# Patient Record
Sex: Male | Born: 1990 | Race: Black or African American | Hispanic: No | Marital: Single | Smoking: Current every day smoker
Health system: Southern US, Community
[De-identification: ages and names within clinical notes are randomized; demographics above are authoritative.]

## PROBLEM LIST (undated history)

## (undated) ENCOUNTER — Emergency Department (HOSPITAL_COMMUNITY): Admission: EM | Payer: Self-pay | Source: Home / Self Care

## (undated) DIAGNOSIS — F32A Depression, unspecified: Secondary | ICD-10-CM

## (undated) DIAGNOSIS — F419 Anxiety disorder, unspecified: Secondary | ICD-10-CM

## (undated) DIAGNOSIS — F4310 Post-traumatic stress disorder, unspecified: Secondary | ICD-10-CM

---
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
## 2010-02-16 ENCOUNTER — Emergency Department (HOSPITAL_COMMUNITY): Admission: EM | Admit: 2010-02-16 | Discharge: 2010-02-16 | Payer: Self-pay | Admitting: Emergency Medicine

## 2010-09-24 ENCOUNTER — Emergency Department (HOSPITAL_COMMUNITY)
Admission: EM | Admit: 2010-09-24 | Discharge: 2010-09-24 | Payer: Self-pay | Source: Home / Self Care | Admitting: Emergency Medicine

## 2010-10-17 ENCOUNTER — Emergency Department (HOSPITAL_COMMUNITY): Admission: EM | Admit: 2010-10-17 | Discharge: 2010-10-17 | Payer: Self-pay | Admitting: Emergency Medicine

## 2011-01-20 ENCOUNTER — Encounter: Payer: Self-pay | Admitting: Emergency Medicine

## 2011-06-15 ENCOUNTER — Emergency Department (HOSPITAL_COMMUNITY)
Admission: EM | Admit: 2011-06-15 | Discharge: 2011-06-15 | Disposition: A | Discharge status: Home / Self Care | Payer: Self-pay | Source: Home / Self Care | Attending: Emergency Medicine | Admitting: Emergency Medicine

## 2011-06-15 DIAGNOSIS — J45909 Unspecified asthma, uncomplicated: Secondary | ICD-10-CM | POA: Insufficient documentation

## 2011-06-15 DIAGNOSIS — F17200 Nicotine dependence, unspecified, uncomplicated: Secondary | ICD-10-CM | POA: Insufficient documentation

## 2011-06-15 DIAGNOSIS — G40909 Epilepsy, unspecified, not intractable, without status epilepticus: Secondary | ICD-10-CM | POA: Insufficient documentation

## 2011-06-15 DIAGNOSIS — E860 Dehydration: Secondary | ICD-10-CM | POA: Insufficient documentation

## 2011-06-15 DIAGNOSIS — R197 Diarrhea, unspecified: Secondary | ICD-10-CM | POA: Insufficient documentation

## 2011-06-15 LAB — DIFFERENTIAL
Basophils Absolute: 0 10*3/uL (ref 0.0–0.1)
Basophils Relative: 0 % (ref 0–1)
Eosinophils Absolute: 0.1 10*3/uL (ref 0.0–0.7)
Eosinophils Relative: 1 % (ref 0–5)
Lymphocytes Relative: 20 % (ref 12–46)
Lymphs Abs: 1.8 10*3/uL (ref 0.7–4.0)
Monocytes Absolute: 0.5 10*3/uL (ref 0.1–1.0)
Monocytes Relative: 5 % (ref 3–12)
Neutro Abs: 6.6 10*3/uL (ref 1.7–7.7)
Neutrophils Relative %: 73 % (ref 43–77)

## 2011-06-15 LAB — URINALYSIS, ROUTINE W REFLEX MICROSCOPIC
Bilirubin Urine: NEGATIVE
Glucose, UA: NEGATIVE mg/dL
Ketones, ur: NEGATIVE mg/dL
Leukocytes, UA: NEGATIVE
Nitrite: NEGATIVE
Protein, ur: NEGATIVE mg/dL
Specific Gravity, Urine: >1.03 — ABNORMAL HIGH (ref 1.005–1.030)
Urobilinogen, UA: 0.2 mg/dL (ref 0.0–1.0)
pH: 6 (ref 5.0–8.0)

## 2011-06-15 LAB — BASIC METABOLIC PANEL
BUN: 15 mg/dL (ref 6–23)
CO2: 29 mEq/L (ref 19–32)
Calcium: 10.5 mg/dL (ref 8.4–10.5)
Chloride: 102 mEq/L (ref 96–112)
Creatinine, Ser: 1.19 mg/dL (ref 0.50–1.35)
GFR calc Af Amer: >60 mL/min (ref >60)
GFR calc non Af Amer: >60 mL/min (ref >60)
Glucose, Bld: 80 mg/dL (ref 70–99)
Potassium: 3.8 mEq/L (ref 3.5–5.1)
Sodium: 138 mEq/L (ref 135–145)

## 2011-06-15 LAB — GLUCOSE, CAPILLARY: Glucose-Capillary: 88 mg/dL (ref 70–99)

## 2011-06-15 LAB — CBC
HCT: 44.4 % (ref 39.0–52.0)
Hemoglobin: 15.1 g/dL (ref 13.0–17.0)
MCH: 28.5 pg (ref 26.0–34.0)
MCHC: 34 g/dL (ref 30.0–36.0)
MCV: 83.8 fL (ref 78.0–100.0)
Platelets: 269 10*3/uL (ref 150–400)
RBC: 5.3 MIL/uL (ref 4.22–5.81)
RDW: 13.8 % (ref 11.5–15.5)
WBC: 9 10*3/uL (ref 4.0–10.5)

## 2011-06-15 LAB — URINE MICROSCOPIC-ADD ON

## 2011-06-17 LAB — URINE CULTURE
Colony Count: 10000
Culture  Setup Time: 201206162021

## 2011-08-18 ENCOUNTER — Encounter: Payer: Self-pay | Admitting: *Deleted

## 2011-08-18 ENCOUNTER — Emergency Department (HOSPITAL_COMMUNITY)
Admission: EM | Admit: 2011-08-18 | Discharge: 2011-08-18 | Disposition: A | Discharge status: Home / Self Care | Payer: Self-pay | Attending: Emergency Medicine | Admitting: Emergency Medicine

## 2011-08-18 DIAGNOSIS — F17200 Nicotine dependence, unspecified, uncomplicated: Secondary | ICD-10-CM | POA: Insufficient documentation

## 2011-08-18 DIAGNOSIS — Z202 Contact with and (suspected) exposure to infections with a predominantly sexual mode of transmission: Secondary | ICD-10-CM | POA: Insufficient documentation

## 2011-08-18 LAB — URINALYSIS, ROUTINE W REFLEX MICROSCOPIC
Bilirubin Urine: NEGATIVE
Glucose, UA: NEGATIVE mg/dL
Hgb urine dipstick: NEGATIVE
Ketones, ur: NEGATIVE mg/dL
Leukocytes, UA: NEGATIVE
Nitrite: NEGATIVE
Protein, ur: NEGATIVE mg/dL
Specific Gravity, Urine: >1.03 — ABNORMAL HIGH (ref 1.005–1.030)
Urobilinogen, UA: 0.2 mg/dL (ref 0.0–1.0)
pH: 5.5 (ref 5.0–8.0)

## 2011-08-18 MED ORDER — CEFTRIAXONE SODIUM 1 G IJ SOLR
500.0000 mg | Freq: Once | INTRAMUSCULAR | Status: AC
  Administered 2011-08-18: 500 mg via INTRAMUSCULAR
  Filled 2011-08-18: qty 1

## 2011-08-18 MED ORDER — AZITHROMYCIN 250 MG PO TABS
1000.0000 mg | ORAL_TABLET | Freq: Once | ORAL | Status: AC
  Administered 2011-08-18: 1000 mg via ORAL
  Filled 2011-08-18: qty 4

## 2011-08-18 NOTE — ED Provider Notes (Signed)
History     CSN: [id number] Arrival date & time: 08/18/2011  6:22 PM  Chief Complaint  Patient presents with  . SEXUALLY TRANSMITTED DISEASE   HPI Comments: Patient comes to ED requesting evaluation for exposure to STD.  States that his sexual partner has been recently treated for gonorrhea.  Patient reports intermittent itching but denies abd pain, rash, penile discharge, swelling or pain.    Patient is a 20 y.o. male presenting with STD exposure. The history is provided by the patient.  Exposure to STD This is a new problem. The current episode started in the past 7 days. The problem occurs intermittently. The problem has been unchanged. Pertinent negatives include no abdominal pain, anorexia, arthralgias, change in bowel habit, chills, fever, headaches, myalgias, nausea, neck pain, numbness, rash, swollen glands, urinary symptoms, vomiting or weakness. The symptoms are aggravated by nothing. He has tried nothing for the symptoms. The treatment provided no relief.    History reviewed. No pertinent past medical history.  History reviewed. No pertinent past surgical history.  History reviewed. No pertinent family history.  History  Substance Use Topics  . Smoking status: Current Everyday Smoker -- 2.0 packs/day  . Smokeless tobacco: Not on file  . Alcohol Use: No      Review of Systems  Constitutional: Negative for fever, chills and appetite change.  HENT: Negative for neck pain.   Gastrointestinal: Negative for nausea, vomiting, abdominal pain, anorexia and change in bowel habit.  Genitourinary: Negative for dysuria, frequency, decreased urine volume, discharge, penile swelling, scrotal swelling, difficulty urinating, penile pain and testicular pain.  Musculoskeletal: Negative for myalgias and arthralgias.  Skin: Negative for color change, rash and wound.  Neurological: Negative for weakness, numbness and headaches.  Hematological: Does not bruise/bleed easily.  All other  systems reviewed and are negative.    Physical Exam  BP 122/73  Pulse 92  Temp(Src) 98.2 F (36.8 C) (Oral)  Resp 18  Ht 6' (1.829 m)  Wt 165 lb 11.2 oz (75.161 kg)  BMI 22.47 kg/m2  SpO2 100%  Physical Exam  Nursing note and vitals reviewed. Constitutional: He is oriented to person, place, and time. He appears well-developed and well-nourished. No distress.  HENT:  Head: Normocephalic and atraumatic.  Mouth/Throat: Oropharynx is clear and moist.  Neck: No thyromegaly present.  Cardiovascular: Normal rate and regular rhythm.   Pulmonary/Chest: Effort normal and breath sounds normal. No respiratory distress.  Abdominal: Soft. He exhibits no distension and no mass. There is no tenderness. There is no rebound and no guarding.  Genitourinary: Testes normal and penis normal. Cremasteric reflex is present. Circumcised. No phimosis, paraphimosis, hypospadias, penile erythema or penile tenderness. No discharge found.  Musculoskeletal: Normal range of motion.  Lymphadenopathy:    He has no cervical adenopathy.  Neurological: He is alert and oriented to person, place, and time.  Skin: Skin is warm and dry.    ED Course  Procedures  MDM   2040 Patient denies any sx's at this time.  He reports recent  exposure to [facility] without penile discharge.  Will treat today and I have advised him of safe sex practices and to have all partners tested.  GC and chlamydia culture pending  The patient appears reasonably screened and/or stabilized for discharge and I doubt any other medical condition or other [facility] requiring further screening, evaluation, or treatment in the ED at this time prior to discharge.   Filed Vitals:   08/18/11 1815  BP: 122/73  Pulse: 92  Temp: 98.2 F (36.8 C)  Resp: 18     MEDICATIONS GIVEN IN THE ED:   Medications  azithromycin (ZITHROMAX) tablet 1,000 mg (1000 mg Oral Given 08/18/11 2038)  cefTRIAXone (ROCEPHIN) injection 500 mg (500 mg Intramuscular Given 08/18/11  2037)     Roberts Bon L. [place], [place] 08/24/11 2251

## 2011-08-18 NOTE — ED Notes (Addendum)
Pt states his partner was dx with gonorrhea and he began to have sx last week. Pt states his sx are itching.

## 2011-08-20 LAB — GC/CHLAMYDIA PROBE AMP, GENITAL
Chlamydia, DNA Probe: NEGATIVE
GC Probe Amp, Genital: NEGATIVE

## 2011-08-21 NOTE — ED Provider Notes (Signed)
Medical screening examination/treatment/procedure(s) were performed by non-physician practitioner and as supervising physician I was immediately available for consultation/collaboration.    Benny Lennert, MD 08/21/11 [phone]

## 2011-08-26 NOTE — ED Provider Notes (Signed)
Medical screening examination/treatment/procedure(s) were performed by non-physician practitioner and as supervising physician I was immediately available for consultation/collaboration.   Benny Lennert, MD 08/26/11 1037

## 2011-11-22 ENCOUNTER — Emergency Department (HOSPITAL_COMMUNITY)
Admission: EM | Admit: 2011-11-22 | Discharge: 2011-11-22 | Disposition: A | Discharge status: Home / Self Care | Payer: Self-pay | Attending: Emergency Medicine | Admitting: Emergency Medicine

## 2011-11-22 ENCOUNTER — Emergency Department (HOSPITAL_COMMUNITY): Payer: Self-pay

## 2011-11-22 ENCOUNTER — Encounter (HOSPITAL_COMMUNITY): Payer: Self-pay | Admitting: Emergency Medicine

## 2011-11-22 DIAGNOSIS — M545 Low back pain, unspecified: Secondary | ICD-10-CM | POA: Insufficient documentation

## 2011-11-22 DIAGNOSIS — M549 Dorsalgia, unspecified: Secondary | ICD-10-CM

## 2011-11-22 MED ORDER — IBUPROFEN 800 MG PO TABS: 800.0000 mg | ORAL_TABLET | Freq: Three times a day (TID) | ORAL | Status: AC | PRN

## 2011-11-22 MED ORDER — HYDROCODONE-ACETAMINOPHEN 5-325 MG PO TABS: 1.0000 | ORAL_TABLET | ORAL | Status: AC | PRN

## 2011-11-22 MED ORDER — HYDROCODONE-ACETAMINOPHEN 5-325 MG PO TABS
1.0000 | ORAL_TABLET | Freq: Once | ORAL | Status: AC
  Administered 2011-11-22: 1 via ORAL
  Filled 2011-11-22: qty 1

## 2011-11-22 MED ORDER — IBUPROFEN 800 MG PO TABS
800.0000 mg | ORAL_TABLET | Freq: Once | ORAL | Status: AC
  Administered 2011-11-22: 800 mg via ORAL
  Filled 2011-11-22: qty 1

## 2011-11-22 NOTE — ED Notes (Signed)
Patient with no complaints at this time. Respirations even and unlabored. Skin warm/dry. Discharge instructions reviewed with patient at this time. Patient given opportunity to voice concerns/ask questions. Patient discharged at this time and left Emergency Department with steady gait.   

## 2011-11-22 NOTE — ED Notes (Signed)
Julie Idol, PA at bedside for patient evaluation. 

## 2011-11-22 NOTE — ED Notes (Signed)
Pt c/o lower back pain down both legs x 2 weeks. Nad.

## 2011-11-24 NOTE — ED Provider Notes (Signed)
Medical screening examination/treatment/procedure(s) were performed by non-physician practitioner and as supervising physician I was immediately available for consultation/collaboration. Devoria Albe, MD, Armando Gang   Ward Givens, MD 11/24/11 [phone]

## 2011-11-24 NOTE — ED Provider Notes (Signed)
History     CSN: [id number] Arrival date & time: 11/22/2011  1:42 PM   First MD Initiated Contact with Patient 11/22/11 1356      Chief Complaint  Patient presents with  . Back Pain    (Consider location/radiation/quality/duration/timing/severity/associated sxs/prior treatment) Patient is a 20 y.o. male presenting with back pain. The history is provided by the patient.  Back Pain  This is a new problem. The current episode started 2 days ago. The problem occurs constantly. 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 The pain is present in the lumbar spine. The quality of the pain is described as aching. The pain does not radiate. The pain is at a severity of 10/10. The symptoms are aggravated by certain positions and bending. The pain is the same all the time. Pertinent negatives include no chest pain, no fever, no numbness, no headaches, no abdominal pain, no bowel incontinence, no perianal numbness, no dysuria, no paresthesias, no paresis, no tingling and no weakness. He has tried nothing for the symptoms.    History reviewed. No pertinent past medical history.  History reviewed. No pertinent past surgical history.  History reviewed. No pertinent family history.  History  Substance Use Topics  . Smoking status: Current Everyday Smoker -- 2.0 packs/day  . Smokeless tobacco: Not on file  . Alcohol Use: No      Review of Systems  Constitutional: Negative for fever.  HENT: Negative for congestion, sore throat and neck pain.   Eyes: Negative.   Respiratory: Negative for chest tightness and shortness of breath.   Cardiovascular: Negative for chest pain.  Gastrointestinal: Negative for nausea, abdominal pain, abdominal distention and bowel incontinence.  Genitourinary: Negative.  Negative for dysuria and difficulty urinating.  Musculoskeletal: Positive  for back pain. Negative for joint swelling and arthralgias.  Skin: Negative.  Negative for rash and wound.  Neurological: Negative for dizziness, tingling, weakness, light-headedness, numbness, headaches and paresthesias.  Hematological: Negative.   Psychiatric/Behavioral: Negative.     Allergies  Review of patient's allergies indicates no known allergies.  Home Medications   Current Outpatient Rx  Name Route Sig Dispense Refill  . IBUPROFEN 800 MG PO TABS Oral Take 800 mg by mouth every 8 (eight) hours as needed. Pain     . HYDROCODONE-ACETAMINOPHEN 5-325 MG PO TABS Oral Take 1 tablet by mouth every 4 (four) hours as needed for pain. 20 tablet 0  . IBUPROFEN 800 MG PO TABS Oral Take 1 tablet (800 mg total) by mouth every 8 (eight) hours as needed for pain. 20 tablet 0    BP 125/66  Pulse 68  Temp(Src) 98.5 F (36.9 C) (Oral)  Resp 17  Ht 6\' 4"  (1.93 m)  Wt 170 lb (77.111 kg)  BMI 20.69 kg/m2  SpO2 100%  Physical Exam  Nursing note and vitals reviewed. Constitutional: He is oriented to person, place, and time. He appears well-developed and well-nourished.  HENT:  Head: Normocephalic.  Eyes: Conjunctivae are normal.  Neck: Normal range of motion. Neck supple.  Cardiovascular: Regular rhythm and intact distal pulses.        Pedal pulses normal.  Pulmonary/Chest: Effort normal. He has no wheezes.  Abdominal: Soft. Bowel sounds are normal. He exhibits no distension and no mass.  Musculoskeletal: Normal range of motion. He exhibits no edema.       Lumbar back:  He exhibits tenderness. He exhibits no swelling, no edema and no spasm.  Neurological: He is alert and oriented to person, place, and time. He has normal strength. He displays no atrophy and no tremor. No cranial nerve deficit or sensory deficit. Gait normal.  Reflex Scores:      Patellar reflexes are 2+ on the right side and 2+ on the left side.      Achilles reflexes are 2+ on the right side and 2+ on the left side.       No strength deficit noted in hip and knee flexor and extensor muscle groups.  Ankle flexion and extension intact.  Skin: Skin is warm and dry.  Psychiatric: He has a normal mood and affect.    ED Course  Procedures (including critical care time)  Labs Reviewed - No data to display Dg Lumbar Spine Complete  11/22/2011  *RADIOLOGY REPORT*  Clinical Data: Low back pain  LUMBAR SPINE - COMPLETE 4+ VIEW  Comparison: None.  Findings: No vertebral compression deformities to suggest compression fracture.  Schmorl's nodes are present at multiple levels in the lower thoracic and lower lumbar spine.  Disc height is maintained.  No pars defect.  IMPRESSION: No acute bony pathology.  Original Report Authenticated By: Donavan Burnet, M.D.     1. Back pain       MDM  Recurrent low back pain with no neuro deficit on exam or by history.        Candis Musa, PA 11/24/11 [phone]

## 2012-03-07 ENCOUNTER — Encounter (HOSPITAL_COMMUNITY): Payer: Self-pay

## 2012-03-07 ENCOUNTER — Emergency Department (HOSPITAL_COMMUNITY): Payer: Self-pay

## 2012-03-07 ENCOUNTER — Emergency Department (HOSPITAL_COMMUNITY)
Admission: EM | Admit: 2012-03-07 | Discharge: 2012-03-07 | Disposition: A | Discharge status: Home / Self Care | Payer: Self-pay | Attending: Emergency Medicine | Admitting: Emergency Medicine

## 2012-03-07 DIAGNOSIS — W010XXA Fall on same level from slipping, tripping and stumbling without subsequent striking against object, initial encounter: Secondary | ICD-10-CM | POA: Insufficient documentation

## 2012-03-07 DIAGNOSIS — Z79899 Other long term (current) drug therapy: Secondary | ICD-10-CM | POA: Insufficient documentation

## 2012-03-07 DIAGNOSIS — M25519 Pain in unspecified shoulder: Secondary | ICD-10-CM | POA: Insufficient documentation

## 2012-03-07 DIAGNOSIS — M25512 Pain in left shoulder: Secondary | ICD-10-CM

## 2012-03-07 DIAGNOSIS — M79609 Pain in unspecified limb: Secondary | ICD-10-CM | POA: Insufficient documentation

## 2012-03-07 DIAGNOSIS — F17200 Nicotine dependence, unspecified, uncomplicated: Secondary | ICD-10-CM | POA: Insufficient documentation

## 2012-03-07 DIAGNOSIS — M25569 Pain in unspecified knee: Secondary | ICD-10-CM | POA: Insufficient documentation

## 2012-03-07 MED ORDER — METHOCARBAMOL 750 MG PO TABS: ORAL_TABLET | ORAL | Status: DC

## 2012-03-07 MED ORDER — IBUPROFEN 800 MG PO TABS
800.0000 mg | ORAL_TABLET | Freq: Once | ORAL | Status: AC
  Administered 2012-03-07: 800 mg via ORAL
  Filled 2012-03-07: qty 1

## 2012-03-07 MED ORDER — METHOCARBAMOL 500 MG PO TABS
1000.0000 mg | ORAL_TABLET | Freq: Once | ORAL | Status: AC
  Administered 2012-03-07: 1000 mg via ORAL
  Filled 2012-03-07: qty 2

## 2012-03-07 NOTE — ED Notes (Addendum)
Pt states was helping "someone move all day and now is sore" denies trauma/injury to bilateral shoulders and rt knee. Refers to knee pain/sorness as "its been bothering me but worse today". No swelling or deformities noted. No distress noted, pt and family asking for repeat sodas and crackers and "maybe a sandwich" All informed of limitation on food budget. No c/o voiced.

## 2012-03-07 NOTE — ED Notes (Signed)
1.Pt was moving furniture today and is now having severe right leg and bil shoulder pain, also fell down a hill 2. Wants rash that is all over his body for 3 months, checked out

## 2012-03-07 NOTE — Discharge Instructions (Signed)
Cryotherapy Cryotherapy means treatment with cold. Ice or gel packs can be used to reduce both pain and swelling. Ice is the most helpful within the first 24 to 48 hours after an injury or flareup from overusing a muscle or joint. Sprains, strains, spasms, burning pain, shooting pain, and aches can all be eased with ice. Ice can also be used when recovering from surgery. Ice is effective, has very few side effects, and is safe for most people to use. PRECAUTIONS  Ice is not a safe treatment option for people with:  Raynaud's phenomenon. This is a condition affecting small blood vessels in the extremities. Exposure to cold may cause your problems to return.   Cold hypersensitivity. There are many forms of cold hypersensitivity, including:   Cold urticaria. Red, itchy hives appear on the skin when the tissues begin to warm after being iced.   Cold erythema. This is a red, itchy rash caused by exposure to cold.   Cold hemoglobinuria. Red blood cells break down when the tissues begin to warm after being iced. The hemoglobin that carry oxygen are passed into the urine because they cannot combine with blood proteins fast enough.   Numbness or altered sensitivity in the area being iced.  If you have any of the following conditions, do not use ice until you have discussed cryotherapy with your caregiver:  Heart conditions, such as arrhythmia, angina, or chronic heart disease.   High blood pressure.   Healing wounds or open skin in the area being iced.   Current infections.   Rheumatoid arthritis.   Poor circulation.   Diabetes.  Ice slows the blood flow in the region it is applied. This is beneficial when trying to stop inflamed tissues from spreading irritating chemicals to surrounding tissues. However, if you expose your skin to cold temperatures for too long or without the proper protection, you can damage your skin or nerves. Watch for signs of skin damage due to cold. HOME CARE  INSTRUCTIONS Follow these tips to use ice and cold packs safely.  Place a dry or damp towel between the ice and skin. A damp towel will cool the skin more quickly, so you may need to shorten the time that the ice is used.   For a more rapid response, add gentle compression to the ice.   Ice for no more than 10 to 20 minutes at a time. The bonier the area you are icing, the less time it will take to get the benefits of ice.   Check your skin after 5 minutes to make sure there are no signs of a poor response to cold or skin damage.   Rest 20 minutes or more in between uses.   Once your skin is numb, you can end your treatment. You can test numbness by very lightly touching your skin. The touch should be so light that you do not see the skin dimple from the pressure of your fingertip. When using ice, most people will feel these normal sensations in this order: cold, burning, aching, and numbness.   Do not use ice on someone who cannot communicate their responses to pain, such as small children or people with dementia.  HOW TO MAKE AN ICE PACK Ice packs are the most common way to use ice therapy. Other methods include ice massage, ice baths, and cryo-sprays. Muscle creams that cause a cold, tingly feeling do not offer the same benefits that ice offers and should not be used as a substitute  unless recommended by your caregiver. To make an ice pack, do one of the following:  Place crushed ice or a bag of frozen vegetables in a sealable plastic bag. Squeeze out the excess air. Place this bag inside another plastic bag. Slide the bag into a pillowcase or place a damp towel between your skin and the bag.   Mix 3 parts water with 1 part rubbing alcohol. Freeze the mixture in a sealable plastic bag. When you remove the mixture from the freezer, it will be slushy. Squeeze out the excess air. Place this bag inside another plastic bag. Slide the bag into a pillowcase or place a damp towel between your skin  and the bag.  SEEK MEDICAL CARE IF:  You develop white spots on your skin. This may give the skin a blotchy (mottled) appearance.   Your skin turns blue or pale.   Your skin becomes waxy or hard.   Your swelling gets worse.  MAKE SURE YOU:   Understand these instructions.   Will watch your condition.   Will get help right away if you are not doing well or get worse.  Document Released: 08/12/2011 Document Revised: 12/05/2011 Document Reviewed: 08/12/2011 [facility] Patient Information 2012 [place], [place].Knee Immobilization You have been prescribed a knee immobilizer. This is used to support and protect an injured or painful knee. Knee immobilizers keep your knee from being used while it is healing. Some of the common immobilizers used include splints (air, plaster, fiberglass or aluminum) or casts. Wear your knee immobilizer as instructed and only remove it as instructed. If the immobilizer is used to protect broken bones, torn ligaments or torn cartilage, it may be 4 to 6 weeks before you are able to begin rehabilitating your knee, and it may be longer than that before you are able to return to athletic activity. HOME CARE INSTRUCTIONS   Use powder to control irritation from sweat and friction.   Adjust the immobilizer to be firm but not tight. Signs of an immobilizer that is too tight include:   Swelling.   Numbness.   Color change in your foot or ankle.   Increased pain.   While resting, raise your leg above the level of your heart. This reduces throbbing and helps healing. Prop it up with pillows.   Remove the immobilizer to bathe and sleep. Wear it other times until you see your doctor again.  When your splint or cast is removed:   Stretching and strengthening are important in caring for and preventing knee injuries. If your knee begins to get sore while you are conditioning, decrease or back off your activities until you no longer have discomfort. Then gradually resume  your activities.   When strengthening your knee, increase your activities a little at a time so as not to develop injuries from over use.   Work out an exercise plan with your caregiver or physical therapist to get the best program for you.  SEEK IMMEDIATE MEDICAL CARE IF:   Your knee seems to be getting worse rather than better.   You have increasing pain or swelling in the knee, foot, or ankle.   You have problems caused by the knee immobilizer or it breaks or needs replacement.   You have increased swelling or redness or soreness (inflammation) in your knee.   Your leg becomes warm and more painful.   You develop an unexplained temperature over 102 F (38.9 C).  MAKE SURE YOU:   Understand these instructions.  Will watch your condition.   Will get help right away if you are not doing well or get worse.  Document Released: 12/16/2005 Document Revised: 12/05/2011 Document Reviewed: 06/03/2007 [facility] Patient Information 2012 [place], [place].   Take the robaxin as directed.  Take ibuprofen up to 800 mg every 8 hrs with food.  Wear the knee immobilizer for comfort and stability.  Apply ice 20-30 min several times daily.  Follow up with dr. Hilda Lias as needed.

## 2012-03-07 NOTE — ED Provider Notes (Addendum)
History     CSN: [id number]  Arrival date & time 03/07/12  1843   First MD Initiated Contact with Patient 03/07/12 1909      Chief Complaint  Patient presents with  . Leg Pain  . Shoulder Pain    (Consider location/radiation/quality/duration/timing/severity/associated sxs/prior treatment) HPI Comments: Pt states he was moving furniture today and hurt his L shoulder and R knee.  He also slipped and fell down a hill today.no injuries specifically from the fall.  The history is provided by the patient. No language interpreter was used.    History reviewed. No pertinent past medical history.  History reviewed. No pertinent past surgical history.  No family history on file.  History  Substance Use Topics  . Smoking status: Current Everyday Smoker -- 2.0 packs/day    Types: Cigarettes  . Smokeless tobacco: Not on file  . Alcohol Use: No      Review of Systems  Musculoskeletal: Negative for back pain.       Knee and shoulder injuries.  Neurological: Negative for weakness and numbness.    Allergies  Review of patient's allergies indicates no known allergies.  Home Medications   Current Outpatient Rx  Name Route Sig Dispense Refill  . METHOCARBAMOL 750 MG PO TABS  One or two tabs po QID 40 tablet 0    BP 123/78  Pulse 96  Temp 98.2 F (36.8 C)  Resp 20  Wt 150 lb (68.04 kg)  SpO2 100%  Physical Exam  Nursing note and vitals reviewed. Constitutional: He is oriented to person, place, and time. He appears well-developed and well-nourished.  HENT:  Head: Normocephalic and atraumatic.  Eyes: EOM are normal.  Neck: Normal range of motion.  Cardiovascular: Normal rate, regular rhythm, normal heart sounds and intact distal pulses.   Pulmonary/Chest: Effort normal and breath sounds normal. No respiratory distress.  Abdominal: Soft. He exhibits no distension. There is no tenderness.  Musculoskeletal: He exhibits tenderness.       Left shoulder: He exhibits  tenderness, bony tenderness and pain. He exhibits normal range of motion, no swelling, no effusion, no crepitus, no deformity, normal pulse and normal strength.       Right knee: He exhibits bony tenderness. He exhibits normal range of motion, no swelling, no effusion, no ecchymosis and no deformity. tenderness found.       Diffuse pain in L shoulder and R knee.  Mild PT to both.  FROM.  Neurological: He is alert and oriented to person, place, and time.  Skin: Skin is warm and dry.  Psychiatric: He has a normal mood and affect. Judgment normal.    ED Course  Procedures (including critical care time)  Labs Reviewed - No data to display Dg Shoulder Left  03/07/2012  *RADIOLOGY REPORT*  Clinical Data: Left shoulder pain following a fall today.  LEFT SHOULDER - 2+ VIEW  Comparison: None.  Findings: Normal appearing bones and soft tissues without fracture or dislocation.  IMPRESSION: Normal examination.  Original Report Authenticated By: Darrol Angel, M.D.   Dg Knee Complete 4 Views Right  03/07/2012  *RADIOLOGY REPORT*  Clinical Data: Right knee pain following a fall today.  RIGHT KNEE - COMPLETE 4+ VIEW  Comparison: None.  Findings: Normal appearing bones and soft tissues without fracture, dislocation or effusion.  IMPRESSION: Normal examination.  Original Report Authenticated By: Darrol Angel, M.D.     1. Shoulder pain, left   2. Knee pain  MDM  Knee immob, ice , ibuprofen and rx for robaxin #40. F/u with dr. Hilda Lias prn        Worthy Rancher, PA 03/07/12 2046  Evalina Field, [place] 07/04/12 [phone]

## 2012-03-08 NOTE — ED Provider Notes (Signed)
Medical screening examination/treatment/procedure(s) were performed by non-physician practitioner and as supervising physician I was immediately available for consultation/collaboration.   Quanasia Defino, MD 03/08/12 1219 

## 2012-07-06 NOTE — ED Provider Notes (Signed)
Medical screening examination/treatment/procedure(s) were performed by non-physician practitioner and as supervising physician I was immediately available for consultation/collaboration.  Geoffery Lyons, MD 07/06/12 [phone]

## 2012-12-02 ENCOUNTER — Emergency Department (HOSPITAL_COMMUNITY)
Admission: EM | Admit: 2012-12-02 | Discharge: 2012-12-02 | Disposition: A | Discharge status: Home / Self Care | Payer: Self-pay | Attending: Emergency Medicine | Admitting: Emergency Medicine

## 2012-12-02 ENCOUNTER — Encounter (HOSPITAL_COMMUNITY): Payer: Self-pay | Admitting: *Deleted

## 2012-12-02 DIAGNOSIS — IMO0001 Reserved for inherently not codable concepts without codable children: Secondary | ICD-10-CM | POA: Insufficient documentation

## 2012-12-02 DIAGNOSIS — F17200 Nicotine dependence, unspecified, uncomplicated: Secondary | ICD-10-CM | POA: Insufficient documentation

## 2012-12-02 DIAGNOSIS — J029 Acute pharyngitis, unspecified: Secondary | ICD-10-CM | POA: Insufficient documentation

## 2012-12-02 DIAGNOSIS — J3489 Other specified disorders of nose and nasal sinuses: Secondary | ICD-10-CM | POA: Insufficient documentation

## 2012-12-02 DIAGNOSIS — R05 Cough: Secondary | ICD-10-CM

## 2012-12-02 DIAGNOSIS — R6883 Chills (without fever): Secondary | ICD-10-CM | POA: Insufficient documentation

## 2012-12-02 DIAGNOSIS — R0789 Other chest pain: Secondary | ICD-10-CM | POA: Insufficient documentation

## 2012-12-02 DIAGNOSIS — R059 Cough, unspecified: Secondary | ICD-10-CM | POA: Insufficient documentation

## 2012-12-02 MED ORDER — IBUPROFEN 800 MG PO TABS: 800.0000 mg | ORAL_TABLET | Freq: Three times a day (TID) | ORAL | Status: DC

## 2012-12-02 MED ORDER — GUAIFENESIN-CODEINE 100-10 MG/5ML PO SYRP: 10.0000 mL | ORAL_SOLUTION | Freq: Three times a day (TID) | ORAL | Status: AC | PRN

## 2012-12-02 NOTE — ED Provider Notes (Signed)
History     CSN: [id number]  Arrival date & time 12/02/12  1116   First MD Initiated Contact with Patient 12/02/12 1133      Chief Complaint  Patient presents with  . Cough  . Sore Throat    (Consider location/radiation/quality/duration/timing/severity/associated sxs/prior treatment) Patient is a 21 y.o. male presenting with cough. The history is provided by the patient.  Cough This is a new problem. The current episode started more than 2 days ago. The problem occurs every few minutes. The problem has been gradually worsening. The cough is productive of sputum. There has been no fever. Associated symptoms include chills, rhinorrhea, sore throat and myalgias. Pertinent negatives include no chest pain, no ear pain, no headaches, no shortness of breath and no wheezing. Associated symptoms comments: nasal congestion, myalgias. He has tried nothing for the symptoms. The treatment provided no relief. He is a smoker. His past medical history does not include pneumonia or asthma.    History reviewed. No pertinent past medical history.  History reviewed. No pertinent past surgical history.  No family history on file.  History  Substance Use Topics  . Smoking status: Current Every Day Smoker -- 2.0 packs/day    Types: Cigarettes  . Smokeless tobacco: Not on file  . Alcohol Use: No      Review of Systems  Constitutional: Positive for chills. Negative for fever, activity change and appetite change.  HENT: Positive for congestion, sore throat and rhinorrhea. Negative for ear pain, facial swelling, trouble swallowing, neck pain and neck stiffness.   Eyes: Negative for visual disturbance.  Respiratory: Positive for cough and chest tightness. Negative for shortness of breath, wheezing and stridor.   Cardiovascular: Negative for chest pain.  Gastrointestinal: Negative for nausea, vomiting and abdominal pain.  Genitourinary: Negative for dysuria and flank pain.  Musculoskeletal: Positive  for myalgias.  Skin: Negative.   Neurological: Negative for dizziness, weakness, numbness and headaches.  Hematological: Negative for adenopathy.  Psychiatric/Behavioral: Negative for confusion.  All other systems reviewed and are negative.    Allergies  Review of patient's allergies indicates no known allergies.  Home Medications  No current outpatient prescriptions on file.  BP 117/63  Pulse 96  Temp 98.5 F (36.9 C) (Oral)  Resp 20  Ht 6' (1.829 m)  Wt 165 lb (74.844 kg)  BMI 22.38 kg/m2  SpO2 100%  Physical Exam  Nursing note and vitals reviewed. Constitutional: He is oriented to person, place, and time. He appears well-developed and well-nourished. No distress.  HENT:  Head: Normocephalic and atraumatic.  Right Ear: Tympanic membrane and ear canal normal.  Left Ear: Tympanic membrane and ear canal normal.  Mouth/Throat: Uvula is midline, oropharynx is clear and moist and mucous membranes are normal. No oropharyngeal exudate.  Eyes: Conjunctivae normal and EOM are normal. Pupils are equal, round, and reactive to light.  Neck: Normal range of motion. Neck supple.  Cardiovascular: Normal rate, regular rhythm, normal heart sounds and intact distal pulses.   No murmur heard. Pulmonary/Chest: Effort normal and breath sounds normal. No respiratory distress. He has no wheezes. He has no rales. He exhibits no tenderness.  Musculoskeletal: Normal range of motion. He exhibits no edema.  Lymphadenopathy:    He has no cervical adenopathy.  Neurological: He is alert and oriented to person, place, and time. He exhibits normal muscle tone. Coordination normal.  Skin: Skin is warm and dry.    ED Course  Procedures (including critical care time)  Labs Reviewed - No  data to display No results found.      MDM   Patient is alert, vital signs are stable, no hypoxia, tachypnea or tachycardia. Patient is well appearing. Likely viral URI. Patient agrees to rest, fluids, and I  will prescribe cough medication for symptomatic treatment.   Pt to f/u with PMD if needed.  Prescribed: Robitussin AC ibuprofen    Emalyn Schou L. [place], [place] 12/02/12 2035

## 2012-12-02 NOTE — ED Notes (Signed)
C/o cough, body aches, and sore throat x 5 days.

## 2012-12-04 NOTE — ED Provider Notes (Signed)
Medical screening examination/treatment/procedure(s) were performed by non-physician practitioner and as supervising physician I was immediately available for consultation/collaboration. Ivan Lacher, MD, FACEP   Zeki Bedrosian L Jarmel Linhardt, MD 12/04/12 2120 

## 2012-12-26 ENCOUNTER — Emergency Department (HOSPITAL_COMMUNITY)
Admission: EM | Admit: 2012-12-26 | Discharge: 2012-12-26 | Disposition: A | Discharge status: Home / Self Care | Payer: Self-pay | Attending: Emergency Medicine | Admitting: Emergency Medicine

## 2012-12-26 ENCOUNTER — Emergency Department (HOSPITAL_COMMUNITY): Payer: Self-pay

## 2012-12-26 ENCOUNTER — Encounter (HOSPITAL_COMMUNITY): Payer: Self-pay | Admitting: Emergency Medicine

## 2012-12-26 DIAGNOSIS — R51 Headache: Secondary | ICD-10-CM

## 2012-12-26 DIAGNOSIS — F17200 Nicotine dependence, unspecified, uncomplicated: Secondary | ICD-10-CM | POA: Insufficient documentation

## 2012-12-26 DIAGNOSIS — G43909 Migraine, unspecified, not intractable, without status migrainosus: Secondary | ICD-10-CM | POA: Insufficient documentation

## 2012-12-26 DIAGNOSIS — R42 Dizziness and giddiness: Secondary | ICD-10-CM | POA: Insufficient documentation

## 2012-12-26 MED ORDER — KETOROLAC TROMETHAMINE 30 MG/ML IJ SOLN
30.0000 mg | Freq: Once | INTRAMUSCULAR | Status: AC
  Administered 2012-12-26: 30 mg via INTRAVENOUS
  Filled 2012-12-26: qty 1

## 2012-12-26 MED ORDER — TRAMADOL HCL 50 MG PO TABS: ORAL_TABLET | ORAL | Status: DC

## 2012-12-26 NOTE — ED Notes (Signed)
Patient with no complaints at this time. Respirations even and unlabored. Skin warm/dry. Discharge instructions reviewed with patient at this time. Patient given opportunity to voice concerns/ask questions. IV removed per policy and band-aid applied to site. Patient discharged at this time and left Emergency Department with steady gait.  

## 2012-12-26 NOTE — ED Notes (Signed)
Pt c/o migraine along with intermittent dizziness and weakness.

## 2012-12-26 NOTE — ED Provider Notes (Signed)
History   This chart was scribed for Hunter Lennert, MD by Sofie Rower, ED Scribe. The patient was seen in room [facility]04/[facility]04 and the patient's care was started at 1:31PM.     CSN: [id number]  Arrival date & time 12/26/12  1203   First MD Initiated Contact with Patient 12/26/12 1331      Chief Complaint  Patient presents with  . Migraine  . Dizziness    (Consider location/radiation/quality/duration/timing/severity/associated sxs/prior treatment) Patient is a 21 y.o. male presenting with migraines. The history is provided by the patient. No language interpreter was used.  Migraine This is a recurrent problem. The current episode started 6 to 12 hours ago. The problem occurs constantly. The problem has been gradually worsening. Associated symptoms include headaches. Pertinent negatives include no chest pain, no abdominal pain and no shortness of breath. Nothing aggravates the symptoms. The symptoms are relieved by position and rest. He has tried rest for the symptoms. The treatment provided mild relief.    PCP is Dr. Renard Matter.   History reviewed. No pertinent past medical history.  History reviewed. No pertinent past surgical history.  History reviewed. No pertinent family history.  History  Substance Use Topics  . Smoking status: Current Every Day Smoker -- 2.0 packs/day    Types: Cigarettes  . Smokeless tobacco: Not on file  . Alcohol Use: No      Review of Systems  Constitutional: Negative for fatigue.  HENT: Negative for congestion, sinus pressure and ear discharge.   Eyes: Negative for discharge.  Respiratory: Negative for cough and shortness of breath.   Cardiovascular: Negative for chest pain.  Gastrointestinal: Negative for abdominal pain and diarrhea.  Genitourinary: Negative for frequency and hematuria.  Musculoskeletal: Negative for back pain.  Skin: Negative for rash.  Neurological: Positive for dizziness and headaches. Negative for seizures.  Hematological:  Negative.   Psychiatric/Behavioral: Negative for hallucinations.    Allergies  Ibuprofen  Home Medications   Current Outpatient Rx  Name  Route  Sig  Dispense  Refill  . IBUPROFEN 800 MG PO TABS   Oral   Take 1 tablet (800 mg total) by mouth 3 (three) times daily. Take with food   21 tablet   0     BP 118/59  Pulse 67  Temp 98.5 F (36.9 C)  SpO2 100%  Physical Exam  Nursing note and vitals reviewed. Constitutional: He is oriented to person, place, and time. He appears well-developed.  HENT:  Head: Normocephalic and atraumatic.  Eyes: Conjunctivae normal and EOM are normal. No scleral icterus.  Neck: Neck supple. No thyromegaly present.  Cardiovascular: Normal rate and regular rhythm.  Exam reveals no gallop and no friction rub.   No murmur heard. Pulmonary/Chest: No stridor. He has no wheezes. He has no rales. He exhibits no tenderness.  Abdominal: He exhibits no distension. There is no tenderness. There is no rebound.  Musculoskeletal: Normal range of motion. He exhibits no edema.  Lymphadenopathy:    He has no cervical adenopathy.  Neurological: He is oriented to person, place, and time. Coordination normal.  Skin: No rash noted. No erythema.  Psychiatric: He has a normal mood and affect. His behavior is normal.    ED Course  Procedures (including critical care time)  DIAGNOSTIC STUDIES: Oxygen Saturation is 100% on room air, normal by my interpretation.    COORDINATION OF CARE:   1:36 PM- Treatment plan concerning x-ray of head discussed with patient. Pt agrees with treatment.  Labs Reviewed - No data to display     Ct Head Wo Contrast  12/26/2012  *RADIOLOGY REPORT*  Clinical Data: Migraine headache  CT HEAD WITHOUT CONTRAST  Technique:  Contiguous axial images were obtained from the base of the skull through the vertex without contrast.  Comparison: None.  Findings: No acute intracranial hemorrhage.  No focal mass lesion. No CT evidence of  acute infarction.   No midline shift or mass effect.  No hydrocephalus.  Basilar cisterns are patent.  Mastoid air cells are clear.  There is mild mucosal thickening in the left sphenoid hemi sinus.  Frontal sinuses are non pneumatized.  IMPRESSION:  1.  No acute intracranial findings. 2.  Mild mucosal inflammation in the sphenoid sinus.   Original Report Authenticated By: Genevive Bi, M.D.       No diagnosis found.    MDM        The chart was scribed for me under my direct supervision.  I personally performed the history, physical, and medical decision making and all procedures in the evaluation of this patient.Hunter Lennert, MD 12/26/12 [phone]

## 2012-12-26 NOTE — ED Notes (Signed)
Patient provided urinal

## 2013-12-19 ENCOUNTER — Emergency Department (HOSPITAL_COMMUNITY)
Admission: EM | Admit: 2013-12-19 | Discharge: 2013-12-19 | Disposition: A | Discharge status: Home / Self Care | Payer: Self-pay | Attending: Emergency Medicine | Admitting: Emergency Medicine

## 2013-12-19 ENCOUNTER — Encounter (HOSPITAL_COMMUNITY): Payer: Self-pay | Admitting: Emergency Medicine

## 2013-12-19 DIAGNOSIS — F17200 Nicotine dependence, unspecified, uncomplicated: Secondary | ICD-10-CM | POA: Insufficient documentation

## 2013-12-19 DIAGNOSIS — K047 Periapical abscess without sinus: Secondary | ICD-10-CM | POA: Insufficient documentation

## 2013-12-19 DIAGNOSIS — K029 Dental caries, unspecified: Secondary | ICD-10-CM | POA: Insufficient documentation

## 2013-12-19 MED ORDER — TRAMADOL HCL 50 MG PO TABS: 50.0000 mg | ORAL_TABLET | Freq: Four times a day (QID) | ORAL | Status: DC | PRN

## 2013-12-19 MED ORDER — TRAMADOL HCL 50 MG PO TABS
50.0000 mg | ORAL_TABLET | Freq: Once | ORAL | Status: AC
  Administered 2013-12-19: 50 mg via ORAL
  Filled 2013-12-19: qty 1

## 2013-12-19 MED ORDER — AMOXICILLIN 250 MG PO CAPS
500.0000 mg | ORAL_CAPSULE | Freq: Once | ORAL | Status: AC
  Administered 2013-12-19: 500 mg via ORAL
  Filled 2013-12-19: qty 2

## 2013-12-19 MED ORDER — AMOXICILLIN 500 MG PO CAPS: 500.0000 mg | ORAL_CAPSULE | Freq: Three times a day (TID) | ORAL | Status: AC

## 2013-12-19 NOTE — ED Notes (Signed)
Pt c/o pain to left side of mouth (teeth on the top and bottom of jaw) x 1 wk.

## 2013-12-20 NOTE — ED Provider Notes (Signed)
CSN: [id number]     Arrival date & time 12/19/13  1933 History   First MD Initiated Contact with Patient 12/19/13 2013     Chief Complaint  Patient presents with  . Dental Pain   (Consider location/radiation/quality/duration/timing/severity/associated sxs/prior Treatment) HPI Comments: Hunter Andrews is a 22 y.o. Male presenting with a 1 week history of increasing dental pain and gingival swelling.   The patient has a history of decay in multiple teeth, with his left lower molars the worst,  Causing constant throbbing pain and now swelling of the gums.  There has been no fevers,  Chills, nausea or vomiting, also no complaint of difficulty swallowing,  Although chewing makes pain worse.  The patient has taken no medicines for this prior to arrival.     The history is provided by the patient.    History reviewed. No pertinent past medical history. History reviewed. No pertinent past surgical history. History reviewed. No pertinent family history. History  Substance Use Topics  . Smoking status: Current Every Day Smoker -- 2.00 packs/day    Types: Cigarettes  . Smokeless tobacco: Not on file  . Alcohol Use: No    Review of Systems  Constitutional: Negative for fever.  HENT: Positive for dental problem. Negative for facial swelling and sore throat.   Respiratory: Negative for shortness of breath.   Musculoskeletal: Negative for neck pain and neck stiffness.    Allergies  Ibuprofen  Home Medications   Current Outpatient Rx  Name  Route  Sig  Dispense  Refill  . amoxicillin (AMOXIL) 500 MG capsule   Oral   Take 1 capsule (500 mg total) by mouth 3 (three) times daily.   30 capsule   0   . traMADol (ULTRAM) 50 MG tablet   Oral   Take 1 tablet (50 mg total) by mouth every 6 (six) hours as needed.   15 tablet   0    BP 175/74  Pulse 89  Temp(Src) 98 F (36.7 C)  Resp 20  Ht 6\' 5"  (1.956 m)  Wt 180 lb (81.647 kg)  BMI 21.34 kg/m2  SpO2 98% Physical Exam   Constitutional: He is oriented to person, place, and time. He appears well-developed and well-nourished. No distress.  HENT:  Head: Normocephalic and atraumatic.  Right Ear: Tympanic membrane and external ear normal.  Left Ear: Tympanic membrane and external ear normal.  Mouth/Throat: Oropharynx is clear and moist and mucous membranes are normal. No oral lesions. No trismus in the jaw. Dental abscesses present.  Severe decay of multiple teeth,  Most profound at the left lower molars.  Surrounding gingival erythema without edema.  No fluctuance,  No facial edema or erythema. Left submandibular adenopathy.  Eyes: Conjunctivae are normal.  Neck: Normal range of motion. Neck supple.  Cardiovascular: Normal rate and normal heart sounds.   Pulmonary/Chest: Effort normal.  Abdominal: He exhibits no distension.  Musculoskeletal: Normal range of motion.  Lymphadenopathy:    He has no cervical adenopathy.  Neurological: He is alert and oriented to person, place, and time.  Skin: Skin is warm and dry. No erythema.  Psychiatric: He has a normal mood and affect.    ED Course  Procedures (including critical care time) Labs Review Labs Reviewed - No data to display Imaging Review No results found.  EKG Interpretation   None       MDM   1. Dental caries    Poor dentition with severe decay,  Probably early abscess along  left lower molars.  Amoxil, tramadol,  Dental referrals given.  The patient appears reasonably screened and/or stabilized for discharge and I doubt any other medical condition or other [facility] requiring further screening, evaluation, or treatment in the ED at this time prior to discharge.     Burgess Amor, PA-C 12/20/13 1339

## 2013-12-27 NOTE — ED Provider Notes (Signed)
Medical screening examination/treatment/procedure(s) were performed by non-physician practitioner and as supervising physician I was immediately available for consultation/collaboration.  EKG Interpretation   None         Shelda Jakes, MD 12/27/13 1023

## 2014-02-11 ENCOUNTER — Emergency Department (HOSPITAL_COMMUNITY)
Admission: EM | Admit: 2014-02-11 | Discharge: 2014-02-11 | Disposition: A | Discharge status: Home / Self Care | Payer: Self-pay | Attending: Emergency Medicine | Admitting: Emergency Medicine

## 2014-02-11 ENCOUNTER — Encounter (HOSPITAL_COMMUNITY): Payer: Self-pay | Admitting: Emergency Medicine

## 2014-02-11 DIAGNOSIS — L03317 Cellulitis of buttock: Principal | ICD-10-CM

## 2014-02-11 DIAGNOSIS — L0231 Cutaneous abscess of buttock: Secondary | ICD-10-CM | POA: Insufficient documentation

## 2014-02-11 DIAGNOSIS — F17200 Nicotine dependence, unspecified, uncomplicated: Secondary | ICD-10-CM | POA: Insufficient documentation

## 2014-02-11 DIAGNOSIS — L0291 Cutaneous abscess, unspecified: Secondary | ICD-10-CM

## 2014-02-11 MED ORDER — LIDOCAINE HCL (PF) 1 % IJ SOLN
5.0000 mL | Freq: Once | INTRAMUSCULAR | Status: DC
  Filled 2014-02-11: qty 5

## 2014-02-11 MED ORDER — SULFAMETHOXAZOLE-TRIMETHOPRIM 800-160 MG PO TABS: 1.0000 | ORAL_TABLET | Freq: Two times a day (BID) | ORAL | Status: DC

## 2014-02-11 MED ORDER — HYDROCODONE-ACETAMINOPHEN 5-325 MG PO TABS: ORAL_TABLET | ORAL | Status: DC

## 2014-02-11 NOTE — Discharge Instructions (Signed)
Abscess °An abscess (boil or furuncle) is an infected area on or under the skin. This area is filled with yellowish-white fluid (pus) and other material (debris). °HOME CARE  °· Only take medicines as told by your doctor. °· If you were given antibiotic medicine, take it as directed. Finish the medicine even if you start to feel better. °· If gauze is used, follow your doctor's directions for changing the gauze. °· To avoid spreading the infection: °· Keep your abscess covered with a bandage. °· Wash your hands well. °· Do not share personal care items, towels, or whirlpools with others. °· Avoid skin contact with others. °· Keep your skin and clothes clean around the abscess. °· Keep all doctor visits as told. °GET HELP RIGHT AWAY IF:  °· You have more pain, puffiness (swelling), or redness in the wound site. °· You have more fluid or blood coming from the wound site. °· You have muscle aches, chills, or you feel sick. °· You have a fever. °MAKE SURE YOU:  °· Understand these instructions. °· Will watch your condition. °· Will get help right away if you are not doing well or get worse. °Document Released: 06/03/2008 Document Revised: 06/16/2012 Document Reviewed: 02/28/2012 °Exit[employer]® Patient Information ©2014 [employer]. ° °Abscess °Care After °An abscess (also called a boil or furuncle) is an infected area that contains a collection of pus. Signs and symptoms of an abscess include pain, tenderness, redness, or hardness, or you may feel a moveable soft area under your skin. An abscess can occur anywhere in the body. The infection may spread to surrounding tissues causing cellulitis. A cut (incision) by the surgeon was made over your abscess and the pus was drained out. Gauze may have been packed into the space to provide a drain that will allow the cavity to heal from the inside outwards. The boil may be painful for 5 to 7 days. Most people with a boil do not have high fevers. Your abscess, if seen early, may  not have localized, and may not have been lanced. If not, another appointment may be required for this if it does not get better on its own or with medications. °HOME CARE INSTRUCTIONS  °· Only take over-the-counter or prescription medicines for pain, discomfort, or fever as directed by your caregiver. °· When you bathe, soak and then remove gauze or iodoform packs at least daily or as directed by your caregiver. You may then wash the wound gently with mild soapy water. Repack with gauze or do as your caregiver directs. °SEEK IMMEDIATE MEDICAL CARE IF:  °· You develop increased pain, swelling, redness, drainage, or bleeding in the wound site. °· You develop signs of generalized infection including muscle aches, chills, fever, or a general ill feeling. °· An oral temperature above 102° F (38.9° C) develops, not controlled by medication. °See your caregiver for a recheck if you develop any of the symptoms described above. If medications (antibiotics) were prescribed, take them as directed. °Document Released: 07/04/2005 Document Revised: 03/09/2012 Document Reviewed: 02/29/2008 °ExitCare® Patient Information ©2014 [employer]. ° °

## 2014-02-11 NOTE — ED Provider Notes (Signed)
  Medical screening examination/treatment/procedure(s) were performed by non-physician practitioner and as supervising physician I was immediately available for consultation/collaboration.      Gerhard Munchobert Nathalee Smarr, MD 02/11/14 2322

## 2014-02-11 NOTE — ED Notes (Signed)
Boil on L buttock noticed on Tuesday.  Has not treated it with anything.

## 2014-02-11 NOTE — ED Provider Notes (Signed)
CSN: [id number]     Arrival date & time 02/11/14  2107 History   First MD Initiated Contact with Patient 02/11/14 2145     Chief Complaint  Patient presents with  . abcsess      (Consider location/radiation/quality/duration/timing/severity/associated sxs/prior Treatment) HPI Comments: Hunter Andrews is a 23 y.o. male who presents to the Emergency Department complaining of abscess to the upper left buttock.  States he noticed swelling and pain to the area 3 days ago.  He reports hx of similar symptoms to the same area several months ago.  Pain is worse with sitting.  Nothing makes the pain better.  He denies fever, chills, red streaks or abdominal pain   The history is provided by the patient.    History reviewed. No pertinent past medical history. History reviewed. No pertinent past surgical history. History reviewed. No pertinent family history. History  Substance Use Topics  . Smoking status: Current Every Day Smoker -- 2.00 packs/day    Types: Cigarettes  . Smokeless tobacco: Not on file  . Alcohol Use: No    Review of Systems  Constitutional: Negative for fever and chills.  Gastrointestinal: Negative for nausea and vomiting.  Musculoskeletal: Negative for arthralgias and joint swelling.  Skin: Positive for color change.       Abscess   Hematological: Negative for adenopathy.  All other systems reviewed and are negative.      Allergies  Ibuprofen  Home Medications  No current outpatient prescriptions on file. BP 127/70  Pulse 111  Temp(Src) 99 F (37.2 C) (Oral)  Resp 16  Ht 6\' 4"  (1.93 m)  Wt 176 lb (79.833 kg)  BMI 21.43 kg/m2  SpO2 97%  Physical Exam  Nursing note and vitals reviewed. Constitutional: He is oriented to person, place, and time. He appears well-developed and well-nourished. No distress.  HENT:  Head: Normocephalic and atraumatic.  Cardiovascular: Normal rate, regular rhythm and normal heart sounds.   No murmur heard. Pulmonary/Chest:  Effort normal and breath sounds normal. No respiratory distress.  Neurological: He is alert and oriented to person, place, and time. He exhibits normal muscle tone. Coordination normal.  Skin: Skin is warm and dry.     4 cm Fluctuant abscess to the left upper intragluteal fold.  No surrounding erythema or drainage    ED Course  Procedures (including critical care time) Labs Review Labs Reviewed - No data to display Imaging Review No results found.  EKG Interpretation   None      INCISION AND DRAINAGE Performed by: Maxwell Caul. Consent: Verbal consent obtained. Risks and benefits: risks, benefits and alternatives were discussed Type: abscess  Body area: left upper intragluteal cleft  Anesthesia: local infiltration  Incision was made with a #11 scalpel.  Local anesthetic: lidocaine 1% w/o epinephrine  Anesthetic total: 4 ml  Complexity: complex Blunt dissection to break up loculations  Drainage: purulent  Drainage amount: large  Packing material: 1/4 in iodoform gauze  Patient tolerance: Patient tolerated the procedure well with no immediate complications.    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  No concerning sx's for cellulitis.  Pt agrees to warm soaks, bactrim and vicodin for pain.  Advised to return in 2 days for recheck if needed.   Vitals reviewed, pt is non-toxic appearing.  Stable for discharge.    The patient appears reasonably screened and/or stabilized for discharge and I doubt any other medical  condition or other [facility] requiring further screening, evaluation, or treatment in the ED at this time prior to discharge.     Tavari Loadholt L. Gabrielle Mester, PA-C 02/11/14 2316

## 2016-02-16 ENCOUNTER — Encounter (HOSPITAL_COMMUNITY): Payer: Self-pay | Admitting: Emergency Medicine

## 2016-02-16 ENCOUNTER — Emergency Department (HOSPITAL_COMMUNITY)
Admission: EM | Admit: 2016-02-16 | Discharge: 2016-02-16 | Disposition: A | Discharge status: Home / Self Care | Payer: Self-pay | Attending: Emergency Medicine | Admitting: Emergency Medicine

## 2016-02-16 ENCOUNTER — Emergency Department (HOSPITAL_COMMUNITY): Payer: Self-pay

## 2016-02-16 DIAGNOSIS — J40 Bronchitis, not specified as acute or chronic: Secondary | ICD-10-CM | POA: Insufficient documentation

## 2016-02-16 DIAGNOSIS — F17210 Nicotine dependence, cigarettes, uncomplicated: Secondary | ICD-10-CM | POA: Insufficient documentation

## 2016-02-16 DIAGNOSIS — R112 Nausea with vomiting, unspecified: Secondary | ICD-10-CM | POA: Insufficient documentation

## 2016-02-16 DIAGNOSIS — Z7982 Long term (current) use of aspirin: Secondary | ICD-10-CM | POA: Insufficient documentation

## 2016-02-16 DIAGNOSIS — R197 Diarrhea, unspecified: Secondary | ICD-10-CM | POA: Insufficient documentation

## 2016-02-16 DIAGNOSIS — M791 Myalgia: Secondary | ICD-10-CM | POA: Insufficient documentation

## 2016-02-16 MED ORDER — ALBUTEROL SULFATE HFA 108 (90 BASE) MCG/ACT IN AERS
2.0000 | INHALATION_SPRAY | RESPIRATORY_TRACT | Status: AC
  Administered 2016-02-16: 2 via RESPIRATORY_TRACT
  Filled 2016-02-16: qty 6.7

## 2016-02-16 MED ORDER — NAPROXEN 250 MG PO TABS
500.0000 mg | ORAL_TABLET | Freq: Once | ORAL | Status: AC
  Administered 2016-02-16: 500 mg via ORAL
  Filled 2016-02-16: qty 2

## 2016-02-16 MED ORDER — ONDANSETRON 4 MG PO TBDP: 4.0000 mg | ORAL_TABLET | Freq: Three times a day (TID) | ORAL | Status: DC | PRN

## 2016-02-16 MED ORDER — BENZONATATE 100 MG PO CAPS: 100.0000 mg | ORAL_CAPSULE | Freq: Three times a day (TID) | ORAL | Status: DC

## 2016-02-16 MED ORDER — BENZONATATE 100 MG PO CAPS: 100.0000 mg | ORAL_CAPSULE | Freq: Once | ORAL | Status: DC

## 2016-02-16 NOTE — Discharge Instructions (Signed)

## 2016-02-16 NOTE — ED Notes (Signed)
Pt states that he has been sick for over a week with a cough and generalized body aches.  Last night began vomiting with diarrhea.  Has vomited x2.

## 2016-02-16 NOTE — ED Notes (Signed)
MD at bedside. 

## 2016-02-16 NOTE — ED Provider Notes (Signed)
CSN: [id number]     Arrival date & time 02/16/16  4098 History  By signing my name below, I, Bethel Born, attest that this documentation has been prepared under the direction and in the presence of Eber Hong, MD. Electronically Signed: Bethel Born, ED Scribe. 02/16/2016. 10:30 AM    Chief Complaint  Patient presents with  . Emesis  . Generalized Body Aches  . Diarrhea    The history is provided by the patient. No language interpreter was used.   Hunter Andrews is a 25 y.o. male who presents to the Emergency Department complaining of N/V/D with onset last night. Pt states that his symptoms started with a cough, fatigue, and myalgias that are worse on the torso 1 week ago. He has been out of Trazodone for 1 month but has been taking "some kind of medicine" that helps him when he "gets worked up". He smokes 3 PPD.   History reviewed. No pertinent past medical history. History reviewed. No pertinent past surgical history. History reviewed. No pertinent family history. Social History  Substance Use Topics  . Smoking status: Current Every Day Smoker -- 2.00 packs/day    Types: Cigarettes  . Smokeless tobacco: None  . Alcohol Use: No    Review of Systems  Constitutional: Positive for fatigue.  Respiratory: Positive for cough.   Gastrointestinal: Positive for nausea, vomiting and diarrhea.  Musculoskeletal: Positive for myalgias.  All other systems reviewed and are negative.  Allergies  Ibuprofen  Home Medications   Prior to Admission medications   Medication Sig Start Date End Date Taking? Authorizing Provider  aspirin 325 MG tablet Take 650 mg by mouth daily.   Yes Historical Provider, MD  benzonatate (TESSALON) 100 MG capsule Take 1 capsule (100 mg total) by mouth every 8 (eight) hours. 02/16/16   Eber Hong, MD  HYDROcodone-acetaminophen (NORCO/VICODIN) 5-325 MG per tablet Take one-two tabs po q 4-6 hrs prn pain Patient not taking: Reported on 02/16/2016 02/11/14    Tammy Triplett, PA-C  ondansetron (ZOFRAN ODT) 4 MG disintegrating tablet Take 1 tablet (4 mg total) by mouth every 8 (eight) hours as needed for nausea. 02/16/16   Eber Hong, MD  sulfamethoxazole-trimethoprim (SEPTRA DS) 800-160 MG per tablet Take 1 tablet by mouth 2 (two) times daily. Patient not taking: Reported on 02/16/2016 02/11/14   Tammy Triplett, PA-C   BP 119/83 mmHg  Pulse 88  Temp(Src) 97.9 F (36.6 C) (Oral)  Resp 16  Ht 6' (1.829 m)  Wt 211 lb (95.709 kg)  BMI 28.61 kg/m2  SpO2 100% Physical Exam  Constitutional: He appears well-developed and well-nourished. No distress.  HENT:  Head: Normocephalic and atraumatic.  Mouth/Throat: Oropharynx is clear and moist. No oropharyngeal exudate.  Eyes: Conjunctivae and EOM are normal. Pupils are equal, round, and reactive to light. Right eye exhibits no discharge. Left eye exhibits no discharge. No scleral icterus.  Neck: Normal range of motion. Neck supple. No JVD present. No thyromegaly present.  Cardiovascular: Normal rate, regular rhythm, normal heart sounds and intact distal pulses.  Exam reveals no gallop and no friction rub.   No murmur heard. Pulmonary/Chest: Effort normal. No respiratory distress. He has wheezes. He has no rales.  Mild end expiratory wheezing.  Frequent coughing with breathing.   Abdominal: Soft. Bowel sounds are normal. He exhibits no distension and no mass. There is no tenderness.  Musculoskeletal: Normal range of motion. He exhibits tenderness. He exhibits no edema.  Tenderness to muscular palpation diffusely.   Lymphadenopathy:  He has no cervical adenopathy.  Neurological: He is alert. Coordination normal.  Skin: Skin is warm and dry. No rash noted. No erythema.  Psychiatric: He has a normal mood and affect. His behavior is normal.  Nursing note and vitals reviewed.   ED Course  Procedures (including critical care time) DIAGNOSTIC STUDIES: Oxygen Saturation is 100% on RA,  normal by my  interpretation.    COORDINATION OF CARE: 10:23 AM Discussed treatment plan which includes CXR and naproxen with pt at bedside and pt agreed to plan.  Labs Review Labs Reviewed - No data to display  Imaging Review Dg Chest 2 View  02/16/2016  CLINICAL DATA:  Cough, generalized body aches, vomiting and diarrhea for over a week. Initial encounter. EXAM: CHEST  2 VIEW COMPARISON:  None. FINDINGS: The lungs are clear. Heart size is normal. No pneumothorax or pleural effusion. No focal bony abnormality. IMPRESSION: Negative chest. Electronically Signed   By: Drusilla Kanner M.D.   On: 02/16/2016 11:11   I have personally reviewed and evaluated these images as part of my medical decision-making.    MDM   Final diagnoses:  Bronchitis    Xray nega I have personally viewed and interpreted the imaging and agree with radiologist interpretation. Tessalon Albuterol Home with f/u as needed, Likely flu I personally performed the services described in this documentation, which was scribed in my presence. The recorded information has been reviewed and is accurate.   Meds given in ED:  Medications  benzonatate (TESSALON) capsule 100 mg (not administered)  albuterol (PROVENTIL HFA;VENTOLIN HFA) 108 (90 Base) MCG/ACT inhaler 2 puff (not administered)  naproxen (NAPROSYN) tablet 500 mg (500 mg Oral Given 02/16/16 1045)    New Prescriptions   BENZONATATE (TESSALON) 100 MG CAPSULE    Take 1 capsule (100 mg total) by mouth every 8 (eight) hours.   ONDANSETRON (ZOFRAN ODT) 4 MG DISINTEGRATING TABLET    Take 1 tablet (4 mg total) by mouth every 8 (eight) hours as needed for nausea.       Eber Hong, MD 02/16/16 1126

## 2016-06-12 ENCOUNTER — Emergency Department (HOSPITAL_COMMUNITY)
Admission: EM | Admit: 2016-06-12 | Discharge: 2016-06-12 | Disposition: A | Discharge status: Home / Self Care | Payer: No Typology Code available for payment source | Attending: Emergency Medicine | Admitting: Emergency Medicine

## 2016-06-12 ENCOUNTER — Encounter (HOSPITAL_COMMUNITY): Payer: Self-pay

## 2016-06-12 ENCOUNTER — Emergency Department (HOSPITAL_COMMUNITY): Payer: No Typology Code available for payment source

## 2016-06-12 DIAGNOSIS — S022XXA Fracture of nasal bones, initial encounter for closed fracture: Secondary | ICD-10-CM | POA: Insufficient documentation

## 2016-06-12 DIAGNOSIS — F17210 Nicotine dependence, cigarettes, uncomplicated: Secondary | ICD-10-CM | POA: Diagnosis not present

## 2016-06-12 DIAGNOSIS — S0993XA Unspecified injury of face, initial encounter: Secondary | ICD-10-CM | POA: Diagnosis present

## 2016-06-12 DIAGNOSIS — Y999 Unspecified external cause status: Secondary | ICD-10-CM | POA: Insufficient documentation

## 2016-06-12 DIAGNOSIS — Y939 Activity, unspecified: Secondary | ICD-10-CM | POA: Insufficient documentation

## 2016-06-12 DIAGNOSIS — Z7982 Long term (current) use of aspirin: Secondary | ICD-10-CM | POA: Insufficient documentation

## 2016-06-12 DIAGNOSIS — S025XXB Fracture of tooth (traumatic), initial encounter for open fracture: Secondary | ICD-10-CM | POA: Insufficient documentation

## 2016-06-12 DIAGNOSIS — M791 Myalgia: Secondary | ICD-10-CM | POA: Insufficient documentation

## 2016-06-12 DIAGNOSIS — Y92410 Unspecified street and highway as the place of occurrence of the external cause: Secondary | ICD-10-CM | POA: Insufficient documentation

## 2016-06-12 MED ORDER — HYDROCODONE-ACETAMINOPHEN 5-325 MG PO TABS: 1.0000 | ORAL_TABLET | Freq: Four times a day (QID) | ORAL | Status: DC | PRN

## 2016-06-12 MED ORDER — HYDROCODONE-ACETAMINOPHEN 5-325 MG PO TABS
2.0000 | ORAL_TABLET | Freq: Once | ORAL | Status: AC
  Administered 2016-06-12: 2 via ORAL
  Filled 2016-06-12: qty 2

## 2016-06-12 MED ORDER — CEPHALEXIN 500 MG PO CAPS: 500.0000 mg | ORAL_CAPSULE | Freq: Four times a day (QID) | ORAL | Status: DC
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
## 2016-06-12 NOTE — ED Notes (Signed)
EDP at bedside updating patient and family. 

## 2016-06-12 NOTE — ED Notes (Signed)
EDP at bedside  

## 2016-06-12 NOTE — ED Notes (Signed)
Dental box placed at bedside 

## 2016-06-12 NOTE — Discharge Instructions (Signed)
Dental Pain Dental pain may be caused by many things, including:  Tooth decay (cavities or caries). Cavities expose the nerve of your tooth to air and hot or cold temperatures. This can cause pain or discomfort.  Abscess or infection. A dental abscess is a collection of infected pus from a bacterial infection in the inner part of the tooth (pulp). It usually occurs at the end of the tooth's root.  Injury.  An unknown reason (idiopathic). Your pain may be mild or severe. It may only occur when:  You are chewing.  You are exposed to hot or cold temperature.  You are eating or drinking sugary foods or beverages, such as soda or candy. Your pain may also be constant. HOME CARE INSTRUCTIONS Watch your dental pain for any changes. The following actions may help to lessen any discomfort that you are feeling:  Take medicines only as directed by your dentist.  If you were prescribed an antibiotic medicine, finish all of it even if you start to feel better.  Keep all follow-up visits as directed by your dentist. This is important.  Do not apply heat to the outside of your face.  Rinse your mouth or gargle with salt water if directed by your dentist. This helps with pain and swelling.  You can make salt water by adding  tsp of salt to 1 cup of warm water.  Apply ice to the painful area of your face:  Put ice in a plastic bag.  Place a towel between your skin and the bag.  Leave the ice on for 20 minutes, 2-3 times per day.  Avoid foods or drinks that cause you pain, such as:  Very hot or very cold foods or drinks.  Sweet or sugary foods or drinks. SEEK MEDICAL CARE IF:  Your pain is not controlled with medicines.  Your symptoms are worse.  You have new symptoms. SEEK IMMEDIATE MEDICAL CARE IF:  You are unable to open your mouth.  You are having trouble breathing or swallowing.  You have a fever.  Your face, neck, or jaw is swollen.   This information is not  intended to replace advice given to you by your health care provider. Make sure you discuss any questions you have with your health care provider.   Document Released: 12/16/2005 Document Revised: 05/02/2015 Document Reviewed: 12/12/2014 Elsevier Interactive Patient Education 2016 [employer] Tourist information centre managerMotor Vehicle Collision It is common to have multiple bruises and sore muscles after a motor vehicle collision (MVC). These tend to feel worse for the first 24 hours. You may have the most stiffness and soreness over the first several hours. You may also feel worse when you wake up the first morning after your collision. After this point, you will usually begin to improve with each day. The speed of improvement often depends on the severity of the collision, the number of injuries, and the location and nature of these injuries. HOME CARE INSTRUCTIONS  Put ice on the injured area.  Put ice in a plastic bag.  Place a towel between your skin and the bag.  Leave the ice on for 15-20 minutes, 3-4 times a day, or as directed by your health care provider.  Drink enough fluids to keep your urine clear or pale yellow. Do not drink alcohol.  Take a warm shower or bath once or twice a day. This will increase blood flow to sore muscles.  You may return to activities as directed by your caregiver. Be careful when  lifting, as this may aggravate neck or back pain.  Only take over-the-counter or prescription medicines for pain, discomfort, or fever as directed by your caregiver. Do not use aspirin. This may increase bruising and bleeding. SEEK IMMEDIATE MEDICAL CARE IF:  You have numbness, tingling, or weakness in the arms or legs.  You develop severe headaches not relieved with medicine.  You have severe neck pain, especially tenderness in the middle of the back of your neck.  You have changes in bowel or bladder control.  There is increasing pain in any area of the body.  You have shortness of breath,  light-headedness, dizziness, or fainting.  You have chest pain.  You feel sick to your stomach (nauseous), throw up (vomit), or sweat.  You have increasing abdominal discomfort.  There is blood in your urine, stool, or vomit.  You have pain in your shoulder (shoulder strap areas).  You feel your symptoms are getting worse. MAKE SURE YOU:  Understand these instructions.  Will watch your condition.  Will get help right away if you are not doing well or get worse.   This information is not intended to replace advice given to you by your health care provider. Make sure you discuss any questions you have with your health care provider.   Document Released: 12/16/2005 Document Revised: 01/06/2015 Document Reviewed: 05/15/2011 Elsevier Interactive Patient Education [employer]

## 2016-08-18 ENCOUNTER — Emergency Department (HOSPITAL_COMMUNITY)
Admission: EM | Admit: 2016-08-18 | Discharge: 2016-08-18 | Disposition: A | Discharge status: Home / Self Care | Payer: Self-pay | Attending: Emergency Medicine | Admitting: Emergency Medicine

## 2016-08-18 ENCOUNTER — Encounter (HOSPITAL_COMMUNITY): Payer: Self-pay | Admitting: Emergency Medicine

## 2016-08-18 DIAGNOSIS — Z7982 Long term (current) use of aspirin: Secondary | ICD-10-CM | POA: Insufficient documentation

## 2016-08-18 DIAGNOSIS — Z79899 Other long term (current) drug therapy: Secondary | ICD-10-CM | POA: Insufficient documentation

## 2016-08-18 DIAGNOSIS — R079 Chest pain, unspecified: Secondary | ICD-10-CM | POA: Insufficient documentation

## 2016-08-18 DIAGNOSIS — F17210 Nicotine dependence, cigarettes, uncomplicated: Secondary | ICD-10-CM | POA: Insufficient documentation

## 2016-08-18 DIAGNOSIS — M549 Dorsalgia, unspecified: Secondary | ICD-10-CM | POA: Insufficient documentation

## 2016-08-18 DIAGNOSIS — J069 Acute upper respiratory infection, unspecified: Secondary | ICD-10-CM | POA: Insufficient documentation

## 2016-08-18 MED ORDER — NAPROXEN 500 MG PO TABS: 500.0000 mg | ORAL_TABLET | Freq: Two times a day (BID) | ORAL | 0 refills | Status: DC

## 2016-08-18 MED ORDER — BENZONATATE 100 MG PO CAPS: 200.0000 mg | ORAL_CAPSULE | Freq: Three times a day (TID) | ORAL | 0 refills | Status: DC | PRN

## 2016-08-18 NOTE — ED Triage Notes (Signed)
Pt reports productive cough, "head cold", pain in chest when coughing.  CP stays in center of chest, mild sob after coughing or smoking.

## 2016-08-18 NOTE — ED Provider Notes (Signed)
[facility]EMERGENCY DEPT Provider Note  By signing my name below, I, Mesha Guinyard, attest that this documentation has been prepared under the direction and in the presence of Treatment Team:  Physician Assistant: Burgess AmorJulie Renata Gambino, PA-C.  Electronically Signed: Arvilla MarketMesha Guinyard, Medical Scribe. 08/20/16. 5:00 PM.  CSN: [id number] Arrival date & time: 08/18/16  1608  History   Chief Complaint Chief Complaint  Patient presents with  . Cough    HPI Comments: Hunter Andrews is a 25 y.o. male who presents to the Emergency Department complaining of cough onset 4 days ago. He states that he is having associated symptoms of sore throat, low grade fever of 99.1, vomiting due to coughing so hard, mid sternal triggered by cough and  rhinorrhea with yellowish sputum. Pt states he's a smoker and his SOB has been triggered by the heat. Pt has smoked 1 cigarette since the onset of his symptoms. Pt suspects he got sick from his 5 mo daughter who came in to the hospital tonight for coughing. He states that he has tried Tylenol this morning with no relief for his symptoms. Pt's face and throat swells when he takes ibuprofen but can tolerate aleve. He denies abdominal pain, and nausea.   The history is provided by the patient. No language interpreter was used.    History reviewed. No pertinent past medical history.  There are no active problems to display for this patient.   History reviewed. No pertinent surgical history.   Home Medications    Prior to Admission medications   Medication Sig Start Date End Date Taking? Authorizing Provider  aspirin 325 MG tablet Take 650 mg by mouth daily.    Historical Provider, MD  benzonatate (TESSALON) 100 MG capsule Take 2 capsules (200 mg total) by mouth 3 (three) times daily as needed. 08/18/16   Burgess AmorJulie Lorris Carducci, PA-C  cephALEXin (KEFLEX) 500 MG capsule Take 1 capsule (500 mg total) by mouth 4 (four) times daily. 06/12/16   Roxy Horsemanobert Browning, PA-C  HYDROcodone-acetaminophen  (NORCO/VICODIN) 5-325 MG tablet Take 1-2 tablets by mouth every 6 (six) hours as needed. 06/12/16   Roxy Horsemanobert Browning, PA-C  naproxen (NAPROSYN) 500 MG tablet Take 1 tablet (500 mg total) by mouth 2 (two) times daily. 08/18/16   Burgess AmorJulie Channell Quattrone, PA-C    Family History History reviewed. No pertinent family history.  Social History Social History  Substance Use Topics  . Smoking status: Current Every Day Smoker    Packs/day: 2.00    Types: Cigarettes  . Smokeless tobacco: Not on file  . Alcohol use No     Allergies   Ibuprofen  Review of Systems Review of Systems  Constitutional: Positive for fever.  HENT: Positive for rhinorrhea and sore throat.   Respiratory: Positive for cough and shortness of breath.   Cardiovascular: Positive for chest pain.  Gastrointestinal: Positive for vomiting. Negative for abdominal pain and nausea.  Musculoskeletal: Positive for back pain.  Psychiatric/Behavioral: Positive for sleep disturbance.    Physical Exam Updated Vital Signs BP 120/56 (BP Location: Left Arm)   Pulse 82   Temp 98.6 F (37 C) (Oral)   Resp 16   Ht 6\' 6"  (1.981 m)   Wt 90.7 kg   SpO2 98%   BMI 23.11 kg/m   Physical Exam  Constitutional: He is oriented to person, place, and time. He appears well-developed and well-nourished. No distress.  HENT:  Head: Atraumatic.  Right Ear: Tympanic membrane, external ear and ear canal normal.  Left Ear: Tympanic membrane, external  ear and ear canal normal.  Nose: Mucosal edema and rhinorrhea present. Right sinus exhibits no maxillary sinus tenderness and no frontal sinus tenderness. Left sinus exhibits no maxillary sinus tenderness and no frontal sinus tenderness.  Mouth/Throat: No uvula swelling. No oropharyngeal exudate, posterior oropharyngeal edema or posterior oropharyngeal erythema.  Eyes: Conjunctivae, EOM and lids are normal. Pupils are equal, round, and reactive to light. Right eye exhibits normal extraocular motion. Left eye  exhibits normal extraocular motion.  Neck: Trachea normal and full passive range of motion without pain. No edema and no erythema present.  Cardiovascular: Normal rate, regular rhythm and normal heart sounds.  Exam reveals no friction rub.   No murmur heard. Pulmonary/Chest: Effort normal. No respiratory distress. He has no decreased breath sounds. He has no wheezes. He has no rhonchi.  Abdominal: There is no tenderness.  Lymphadenopathy:    He has no cervical adenopathy.  Neurological: He is alert and oriented to person, place, and time.  Skin: Skin is warm and dry. No rash noted. He is not diaphoretic.  Psychiatric: He has a normal mood and affect. His behavior is normal.    ED Treatments / Results  Labs (all labs ordered are listed, but only abnormal results are displayed) Labs Reviewed - No data to display  DIAGNOSTIC STUDIES: Oxygen Saturation is 98% on RA, nl by my interpretation.    COORDINATION OF CARE: 1:16 PM Discussed treatment plan with pt at bedside and pt agreed to plan.  EKG  EKG Interpretation  Date/Time:  Sunday August 18 2016 16:20:22 EDT Ventricular Rate:  82 PR Interval:  142 QRS Duration: 82 QT Interval:  356 QTC Calculation: 415 R Axis:   79 Text Interpretation:  Normal sinus rhythm Nonspecific T wave abnormality Abnormal ECG No old tracing to compare Confirmed by MILLER  MD, BRIAN ([id number]) on 08/18/2016 4:26:04 PM      Radiology No results found.  Procedures Procedures (including critical care time)  Medications Ordered in ED Medications - No data to display   Initial Impression / Assessment and Plan / ED Course  I have reviewed the triage vital signs and the nursing notes.  Pertinent labs & imaging results that were available during my care of the patient were reviewed by me and considered in my medical decision making (see chart for details).  Clinical Course    Pt states he can tolerate Naproxen . Exam and hx suggestive of viral uri.  No  emesis or any obvious sx on exam today except for nasal congestion.  No emesis or nausea at present. He was prescribed naproxen for discomfort, tessalon for reduction in cough sx as this virus resolves.  Advised rest, increased fluid intake. Recheck with pcp or return here for any new or persistent sx.  Discussed smoking cessation.  Final Clinical Impressions(s) / ED Diagnoses   Final diagnoses:  URI (upper respiratory infection)    New Prescriptions Discharge Medication List as of 08/18/2016  5:13 PM    START taking these medications   Details  benzonatate (TESSALON) 100 MG capsule Take 2 capsules (200 mg total) by mouth 3 (three) times daily as needed., Starting Sun 08/18/2016, Print    naproxen (NAPROSYN) 500 MG tablet Take 1 tablet (500 mg total) by mouth 2 (two) times daily., Starting Sun 08/18/2016, Print        I personally performed the services described in this documentation, which was scribed in my presence. The recorded information has been reviewed and is accurate.  Burgess Amor, PA-C 08/20/16 1320    Bethann Berkshire, MD 08/21/16 1245

## 2016-12-18 ENCOUNTER — Emergency Department (HOSPITAL_COMMUNITY)
Admission: EM | Admit: 2016-12-18 | Discharge: 2016-12-18 | Disposition: A | Discharge status: Home / Self Care | Payer: Self-pay | Attending: Dermatology | Admitting: Dermatology

## 2016-12-18 ENCOUNTER — Encounter (HOSPITAL_COMMUNITY): Payer: Self-pay | Admitting: Emergency Medicine

## 2016-12-18 ENCOUNTER — Emergency Department (HOSPITAL_COMMUNITY): Payer: Self-pay

## 2016-12-18 DIAGNOSIS — F17210 Nicotine dependence, cigarettes, uncomplicated: Secondary | ICD-10-CM | POA: Insufficient documentation

## 2016-12-18 DIAGNOSIS — Z7982 Long term (current) use of aspirin: Secondary | ICD-10-CM | POA: Insufficient documentation

## 2016-12-18 DIAGNOSIS — Z5321 Procedure and treatment not carried out due to patient leaving prior to being seen by health care provider: Secondary | ICD-10-CM | POA: Insufficient documentation

## 2016-12-18 DIAGNOSIS — Z791 Long term (current) use of non-steroidal anti-inflammatories (NSAID): Secondary | ICD-10-CM | POA: Insufficient documentation

## 2016-12-18 NOTE — ED Triage Notes (Signed)
Pt also c/o cough. 

## 2016-12-18 NOTE — ED Triage Notes (Signed)
Pt states he thinks he has a stomach virus. Pt c/o weakness, chills, diarrhea and body aches.

## 2016-12-18 NOTE — ED Notes (Signed)
Pt called multiple times for xray and lab. No answer. Pt not present in waiting area.

## 2017-03-04 ENCOUNTER — Emergency Department (HOSPITAL_COMMUNITY)
Admission: EM | Admit: 2017-03-04 | Discharge: 2017-03-05 | Disposition: A | Discharge status: Home / Self Care | Payer: Self-pay | Attending: Emergency Medicine | Admitting: Emergency Medicine

## 2017-03-04 ENCOUNTER — Emergency Department (HOSPITAL_COMMUNITY): Payer: Self-pay

## 2017-03-04 ENCOUNTER — Encounter (HOSPITAL_COMMUNITY): Payer: Self-pay

## 2017-03-04 DIAGNOSIS — F17210 Nicotine dependence, cigarettes, uncomplicated: Secondary | ICD-10-CM | POA: Insufficient documentation

## 2017-03-04 DIAGNOSIS — R0989 Other specified symptoms and signs involving the circulatory and respiratory systems: Secondary | ICD-10-CM | POA: Insufficient documentation

## 2017-03-04 DIAGNOSIS — F1490 Cocaine use, unspecified, uncomplicated: Secondary | ICD-10-CM | POA: Insufficient documentation

## 2017-03-04 DIAGNOSIS — J029 Acute pharyngitis, unspecified: Secondary | ICD-10-CM | POA: Insufficient documentation

## 2017-03-04 NOTE — ED Triage Notes (Addendum)
I have a piece of wire stuck in my throat from smoking crack.  It has been there for an hour per pt.  Feels like my throat is swelling.  It is like a mesh wire brillo pad that you use in the kitchen.

## 2017-03-04 NOTE — ED Provider Notes (Signed)
[facility]EM[facility]Y DEPT Provider Note   CSN: [id number] Arrival date & time: 03/04/17  2037  By signing my name below, I, Hunter Andrews, attest that this documentation has been prepared under the direction and in the presence of Dione Boozeavid Anupama Piehl, MD. Electronically Signed: Alyssa GroveMartin Andrews, ED Scribe. 03/04/17. 11:31 PM.   History   Chief Complaint Chief Complaint  Patient presents with  . Foreign Body   The history is provided by the patient. No language interpreter was used.   HPI Comments: Hunter Andrews is a 26 y.o. male who presents to the Emergency Department complaining of constant, moderate foreign body sensation to the right side of his throat beginning at 5 PM today. He believes a piece of wire mesh/screen may have became lodged in his throat after smoking crack. Pt is able to swallow with pain and is able to tolerate PO intake.   History reviewed. No pertinent past medical history.  There are no active problems to display for this patient.   History reviewed. No pertinent surgical history.     Home Medications    Prior to Admission medications   Not on File    Family History Family History  Problem Relation Age of Onset  . Diabetes Mother   . Diabetes Other     Social History Social History  Substance Use Topics  . Smoking status: Current Every Day Smoker    Packs/day: 2.00    Types: Cigarettes  . Smokeless tobacco: Never Used  . Alcohol use No     Allergies   Ibuprofen   Review of Systems Review of Systems  HENT: Positive for sore throat. Negative for trouble swallowing.        +Pain with swallowing +foreign body sensation in throat  All other systems reviewed and are negative.   Physical Exam Updated Vital Signs BP 123/75 (BP Location: Right Arm)   Pulse 92   Temp 98.7 F (37.1 C) (Oral)   Resp 16   Ht 6' (1.829 m)   Wt 172 lb (78 kg)   SpO2 100%   BMI 23.33 kg/m   Physical Exam  Constitutional: He is oriented to person, place, and time. He  appears well-developed and well-nourished.  HENT:  Head: Normocephalic and atraumatic.  Right tonsil mildly swollen compared to left No foreign bodies seen No pooling of secretions Normal phonation   Eyes: EOM are normal. Pupils are equal, round, and reactive to light.  Neck: Normal range of motion. Neck supple. No JVD present.  Cardiovascular: Normal rate, regular rhythm and normal heart sounds.   No murmur heard. Pulmonary/Chest: Effort normal and breath sounds normal. He has no wheezes. He has no rales. He exhibits no tenderness.  Abdominal: Soft. Bowel sounds are normal. He exhibits no distension and no mass. There is no tenderness.  Musculoskeletal: Normal range of motion. He exhibits no edema.  Lymphadenopathy:    He has no cervical adenopathy.  Neurological: He is alert and oriented to person, place, and time. No cranial nerve deficit. He exhibits normal muscle tone. Coordination normal.  Skin: Skin is warm and dry. No rash noted.  Psychiatric: He has a normal mood and affect. His behavior is normal. Judgment and thought content normal.  Nursing note and vitals reviewed.   ED Treatments / Results  DIAGNOSTIC STUDIES: Oxygen Saturation is 100% on RA, normal by my interpretation.    COORDINATION OF CARE: 11:30 PM Discussed treatment plan with pt at bedside which includes CT Soft tissue neck and pt  agreed to plan.  Radiology Dg Neck Soft Tissue  Result Date: 03/05/2017 CLINICAL DATA:  Foreign body sensation. The patient believes she is piece of wire in his throat. EXAM: NECK SOFT TISSUES - 1+ VIEW COMPARISON:  CT 06/12/2016 FINDINGS: Linear opacity superimposed on the piriform sinus at the C5-6 level is unchanged from the CT of 06/12/2016 and corresponds to cricoarytenoid calcifications on that prior CT. No foreign body is evident in the airway. Epiglottis is normal. Pharyngeal an cervical airway is unremarkable. IMPRESSION: Negative. Electronically Signed   By: Ellery Plunk  M.D.   On: 03/05/2017 00:07   Ct Soft Tissue Neck Wo Contrast  Result Date: 03/05/2017 CLINICAL DATA:  Initial evaluation for constant foreign body sensation in right side of throat. EXAM: CT NECK WITHOUT CONTRAST TECHNIQUE: Multidetector CT imaging of the neck was performed following the standard protocol without intravenous contrast. COMPARISON:  Prior radiograph from earlier the same day. FINDINGS: Pharynx and larynx: Oral cavity within normal limits without mass lesion or loculated fluid collection. No radiopaque foreign body. No acute abnormality about the dentition. Palatine tonsils symmetric and within normal limits. Parapharyngeal fat preserved. Epiglottis within normal limits. Vallecula is clear. Retropharyngeal soft tissues within normal limits. Remainder of the hypopharynx and supraglottic larynx clear. Piriform sinuses clear. No radiopaque foreign body identified. True cords within normal limits. Subglottic airway clear. Salivary glands: Salivary glands including the parotid and submandibular glands are normal. Thyroid: Thyroid normal. Lymph nodes: Shotty subcentimeter lymph nodes present within the neck bilaterally. No pathologically enlarged lymph nodes identified. Vascular: Evaluation the vasculature limited on this noncontrast examination. Limited intracranial: Unremarkable. Visualized orbits: Visualized globes and orbital soft tissues within normal limits. Mastoids and visualized paranasal sinuses: Visualized paranasal sinuses are clear. Visualized mastoids are clear. Visualize middle ear cavities are clear. Skeleton: No acute osseous abnormality. No worrisome lytic or blastic osseous lesions. Upper chest: Visualized upper mediastinum within normal limits. Visualized lungs are clear. Focal scarring noted at the right lung apex. Other: No other significant finding. IMPRESSION: Negative CT of the neck. No radiopaque foreign body identified. No other acute abnormality identified. Electronically  Signed   By: Rise Mu M.D.   On: 03/05/2017 00:23    Procedures Procedures (including critical care time)  Medications Ordered in ED Medications - No data to display   Initial Impression / Assessment and Plan / ED Course  I have reviewed the triage vital signs and the nursing notes.  Pertinent labs & imaging results that were available during my care of the patient were reviewed by me and considered in my medical decision making (see chart for details).  Foreign body sensation in the throat. No findings on exam. Soft tissue neck x-rays are read by radiologist as showing no foreign body. I questioned whether the reaction was something visible on the lateral x-ray. He was sent for CT of the soft tissues of neck which also showed no evidence of foreign body. Likely mucosal abrasion causing foreign body sensation. He is referred to ENT for follow-up.  Final Clinical Impressions(s) / ED Diagnoses   Final diagnoses:  Foreign body sensation in throat    New Prescriptions New Prescriptions   No medications on file   I personally performed the services described in this documentation, which was scribed in my presence. The recorded information has been reviewed and is accurate.     Dione Booze, MD 03/05/17 [phone]

## 2017-03-05 NOTE — ED Notes (Signed)
Pt alert & oriented x4, stable gait. Patient given discharge instructions, paperwork & prescription(s). Patient  instructed to stop at the registration desk to finish any additional paperwork. Patient verbalized understanding. Pt left department w/ no further questions. 

## 2017-03-05 NOTE — Discharge Instructions (Signed)
Your x-rays and CT scan did not show anything in your throat. Use lozenges and throat sprays as needed. Follow up with the ENT doctor.

## 2017-07-02 ENCOUNTER — Emergency Department (HOSPITAL_COMMUNITY): Payer: Self-pay

## 2017-07-02 ENCOUNTER — Emergency Department (HOSPITAL_COMMUNITY)
Admission: EM | Admit: 2017-07-02 | Discharge: 2017-07-02 | Disposition: A | Discharge status: Home / Self Care | Payer: Self-pay | Attending: Emergency Medicine | Admitting: Emergency Medicine

## 2017-07-02 ENCOUNTER — Encounter (HOSPITAL_COMMUNITY): Payer: Self-pay | Admitting: *Deleted

## 2017-07-02 DIAGNOSIS — A09 Infectious gastroenteritis and colitis, unspecified: Secondary | ICD-10-CM | POA: Insufficient documentation

## 2017-07-02 DIAGNOSIS — F17210 Nicotine dependence, cigarettes, uncomplicated: Secondary | ICD-10-CM | POA: Insufficient documentation

## 2017-07-02 LAB — COMPREHENSIVE METABOLIC PANEL
ALT: 34 U/L (ref 17–63)
AST: 30 U/L (ref 15–41)
Albumin: 3.4 g/dL — ABNORMAL LOW (ref 3.5–5.0)
Alkaline Phosphatase: 36 U/L — ABNORMAL LOW (ref 38–126)
Anion gap: 4 — ABNORMAL LOW (ref 5–15)
BUN: 9 mg/dL (ref 6–20)
CO2: 28 mmol/L (ref 22–32)
Calcium: 8.7 mg/dL — ABNORMAL LOW (ref 8.9–10.3)
Chloride: 108 mmol/L (ref 101–111)
Creatinine, Ser: 1.15 mg/dL (ref 0.61–1.24)
GFR calc Af Amer: >60 mL/min (ref >60)
GFR calc non Af Amer: >60 mL/min (ref >60)
Glucose, Bld: 94 mg/dL (ref 65–99)
Potassium: 4.1 mmol/L (ref 3.5–5.1)
Sodium: 140 mmol/L (ref 135–145)
Total Bilirubin: 0.6 mg/dL (ref 0.3–1.2)
Total Protein: 5.6 g/dL — ABNORMAL LOW (ref 6.5–8.1)

## 2017-07-02 LAB — CBC WITH DIFFERENTIAL/PLATELET
Basophils Absolute: 0 10*3/uL (ref 0.0–0.1)
Basophils Relative: 0 %
Eosinophils Absolute: 0.1 10*3/uL (ref 0.0–0.7)
Eosinophils Relative: 1 %
HCT: 43.6 % (ref 39.0–52.0)
Hemoglobin: 14.7 g/dL (ref 13.0–17.0)
Lymphocytes Relative: 18 %
Lymphs Abs: 2 10*3/uL (ref 0.7–4.0)
MCH: 28.3 pg (ref 26.0–34.0)
MCHC: 33.7 g/dL (ref 30.0–36.0)
MCV: 84 fL (ref 78.0–100.0)
Monocytes Absolute: 0.3 10*3/uL (ref 0.1–1.0)
Monocytes Relative: 3 %
Neutro Abs: 8.9 10*3/uL — ABNORMAL HIGH (ref 1.7–7.7)
Neutrophils Relative %: 78 %
Platelets: 235 10*3/uL (ref 150–400)
RBC: 5.19 MIL/uL (ref 4.22–5.81)
RDW: 13.8 % (ref 11.5–15.5)
WBC: 11.4 10*3/uL — ABNORMAL HIGH (ref 4.0–10.5)

## 2017-07-02 MED ORDER — SODIUM CHLORIDE 0.9 % IV BOLUS (SEPSIS): 1000.0000 mL | Freq: Once | INTRAVENOUS | Status: DC

## 2017-07-02 NOTE — ED Provider Notes (Signed)
[facility] Provider Note   CSN: [id number] Arrival date & time: 07/02/17  1455     History   Chief Complaint Chief Complaint  Patient presents with  . Abdominal Pain    HPI Villa Herbndre L Cipollone is a 26 y.o. male.  Patient complains of abdominal cramping and diarrhea for 2 days. No vomiting. No blood in his diarrhea   The history is provided by the patient. No language interpreter was used.  Abdominal Pain   This is a new problem. The current episode started 2 days ago. The problem occurs constantly. The problem has been gradually improving. The pain is associated with an unknown factor. The pain is located in the generalized abdominal region. The quality of the pain is aching. Associated symptoms include diarrhea. Pertinent negatives include frequency, hematuria and headaches.    History reviewed. No pertinent past medical history.  There are no active problems to display for this patient.   History reviewed. No pertinent surgical history.     Home Medications    Prior to Admission medications   Not on File    Family History Family History  Problem Relation Age of Onset  . Diabetes Mother   . Diabetes Other     Social History Social History  Substance Use Topics  . Smoking status: Current Every Day Smoker    Packs/day: 2.00    Types: Cigarettes  . Smokeless tobacco: Never Used  . Alcohol use No     Allergies   Ibuprofen   Review of Systems Review of Systems  Constitutional: Negative for appetite change and fatigue.  HENT: Negative for congestion, ear discharge and sinus pressure.   Eyes: Negative for discharge.  Respiratory: Negative for cough.   Cardiovascular: Negative for chest pain.  Gastrointestinal: Positive for abdominal pain and diarrhea.  Genitourinary: Negative for frequency and hematuria.  Musculoskeletal: Negative for back pain.  Skin: Negative for rash.  Neurological: Negative for seizures and headaches.    Psychiatric/Behavioral: Negative for hallucinations.     Physical Exam Updated Vital Signs Ht 6' (1.829 m)   Wt 77.1 kg (170 lb)   BMI 23.06 kg/m   Physical Exam  Constitutional: He is oriented to person, place, and time. He appears well-developed.  HENT:  Head: Normocephalic.  Eyes: Conjunctivae and EOM are normal. No scleral icterus.  Neck: Neck supple. No thyromegaly present.  Cardiovascular: Normal rate and regular rhythm.  Exam reveals no gallop and no friction rub.   No murmur heard. Pulmonary/Chest: No stridor. He has no wheezes. He has no rales. He exhibits no tenderness.  Abdominal: He exhibits no distension. There is tenderness. There is no rebound.  Mild tenderness throughout lower abdomen  Musculoskeletal: Normal range of motion. He exhibits no edema.  Lymphadenopathy:    He has no cervical adenopathy.  Neurological: He is oriented to person, place, and time. He exhibits normal muscle tone. Coordination normal.  Skin: No rash noted. No erythema.  Psychiatric: He has a normal mood and affect. His behavior is normal.     ED Treatments / Results  Labs (all labs ordered are listed, but only abnormal results are displayed) Labs Reviewed  CBC WITH DIFFERENTIAL/PLATELET - Abnormal; Notable for the following:       Result Value   WBC 11.4 (*)    Neutro Abs 8.9 (*)    All other components within normal limits  COMPREHENSIVE METABOLIC PANEL - Abnormal; Notable for the following:    Calcium 8.7 (*)    Total  Protein 5.6 (*)    Albumin 3.4 (*)    Alkaline Phosphatase 36 (*)    Anion gap 4 (*)    All other components within normal limits    EKG  EKG Interpretation None       Radiology Dg Abd Acute W/chest  Result Date: 07/02/2017 CLINICAL DATA:  Abdominal pain and diarrhea onset yesterday EXAM: DG ABDOMEN ACUTE W/ 1V CHEST COMPARISON:  CXR from 02/16/2016 FINDINGS: There is no evidence of dilated bowel loops or free intraperitoneal air. Moderate colonic stool  burden is noted within large bowel. No bowel obstruction is seen. No radiopaque calculi or other significant radiographic abnormality is seen. Heart size and mediastinal contours are within normal limits. Both lungs are clear. IMPRESSION: No bowel obstruction. Increased colonic stool burden is noted. No acute cardiopulmonary disease. Electronically Signed   By: Tollie Eth M.D.   On: 07/02/2017 16:51    Procedures Procedures (including critical care time)  Medications Ordered in ED Medications  sodium chloride 0.9 % bolus 1,000 mL (not administered)     Initial Impression / Assessment and Plan / ED Course  I have reviewed the triage vital signs and the nursing notes.  Pertinent labs & imaging results that were available during my care of the patient were reviewed by me and considered in my medical decision making (see chart for details).     Patient with gastroenteritis that seems to be improving. He'll drink plenty of fluids and take Tylenol or Motrin if needed  Final Clinical Impressions(s) / ED Diagnoses   Final diagnoses:  Diarrhea of infectious origin    New Prescriptions New Prescriptions   No medications on file     Bethann Berkshire, MD 07/02/17 [phone]

## 2017-07-02 NOTE — ED Triage Notes (Signed)
Abdominal pain with diarrhea onset yesterday, also needs work noted

## 2017-07-02 NOTE — Discharge Instructions (Signed)
Drink plenty of  fluids.  Tylenol or motrin for pain.  Follow up if not improving °

## 2017-12-18 ENCOUNTER — Emergency Department (HOSPITAL_COMMUNITY)
Admission: EM | Admit: 2017-12-18 | Discharge: 2017-12-18 | Disposition: A | Discharge status: Home / Self Care | Payer: Self-pay | Attending: Emergency Medicine | Admitting: Emergency Medicine

## 2017-12-18 ENCOUNTER — Emergency Department (HOSPITAL_COMMUNITY): Payer: Self-pay

## 2017-12-18 ENCOUNTER — Encounter (HOSPITAL_COMMUNITY): Payer: Self-pay | Admitting: Emergency Medicine

## 2017-12-18 DIAGNOSIS — R69 Illness, unspecified: Secondary | ICD-10-CM

## 2017-12-18 DIAGNOSIS — F17210 Nicotine dependence, cigarettes, uncomplicated: Secondary | ICD-10-CM | POA: Insufficient documentation

## 2017-12-18 DIAGNOSIS — J111 Influenza due to unidentified influenza virus with other respiratory manifestations: Secondary | ICD-10-CM | POA: Insufficient documentation

## 2017-12-18 LAB — CBC WITH DIFFERENTIAL/PLATELET
Basophils Absolute: 0 10*3/uL (ref 0.0–0.1)
Basophils Relative: 0 %
Eosinophils Absolute: 0.1 10*3/uL (ref 0.0–0.7)
Eosinophils Relative: 1 %
HCT: 43.4 % (ref 39.0–52.0)
Hemoglobin: 14.3 g/dL (ref 13.0–17.0)
Lymphocytes Relative: 52 %
Lymphs Abs: 5.3 10*3/uL (ref 0.7–4.0)
MCH: 27.2 pg (ref 26.0–34.0)
MCHC: 32.9 g/dL (ref 30.0–36.0)
MCV: 82.5 fL (ref 78.0–100.0)
Monocytes Absolute: 1.3 10*3/uL (ref 0.1–1.0)
Monocytes Relative: 13 %
Neutro Abs: 3.4 10*3/uL (ref 1.7–7.7)
Neutrophils Relative %: 34 %
Platelets: 361 10*3/uL (ref 150–400)
RBC: 5.26 MIL/uL (ref 4.22–5.81)
RDW: 14.6 % (ref 11.5–15.5)
WBC: 10.1 10*3/uL (ref 4.0–10.5)

## 2017-12-18 LAB — COMPREHENSIVE METABOLIC PANEL
ALT: 65 U/L — ABNORMAL HIGH (ref 17–63)
AST: 59 U/L — ABNORMAL HIGH (ref 15–41)
Albumin: 3.2 g/dL — ABNORMAL LOW (ref 3.5–5.0)
Alkaline Phosphatase: 61 U/L (ref 38–126)
Anion gap: 8 (ref 5–15)
BUN: 10 mg/dL (ref 6–20)
CO2: 26 mmol/L (ref 22–32)
Calcium: 8.7 mg/dL — ABNORMAL LOW (ref 8.9–10.3)
Chloride: 104 mmol/L (ref 101–111)
Creatinine, Ser: 1.06 mg/dL (ref 0.61–1.24)
GFR calc Af Amer: >60 mL/min (ref >60)
GFR calc non Af Amer: >60 mL/min (ref >60)
Glucose, Bld: 90 mg/dL (ref 65–99)
Potassium: 3.7 mmol/L (ref 3.5–5.1)
Sodium: 138 mmol/L (ref 135–145)
Total Bilirubin: 0.5 mg/dL (ref 0.3–1.2)
Total Protein: 6.4 g/dL — ABNORMAL LOW (ref 6.5–8.1)

## 2017-12-18 MED ORDER — PROMETHAZINE HCL 25 MG PO TABS: 25.0000 mg | ORAL_TABLET | Freq: Four times a day (QID) | ORAL | 0 refills | Status: DC | PRN

## 2017-12-18 MED ORDER — TRAMADOL HCL 50 MG PO TABS
50.0000 mg | ORAL_TABLET | Freq: Four times a day (QID) | ORAL | 0 refills | Status: DC | PRN
Start: 2017-12-18 — End: 2018-07-17

## 2017-12-18 MED ORDER — TRAMADOL HCL 50 MG PO TABS
50.0000 mg | ORAL_TABLET | Freq: Once | ORAL | Status: AC
  Administered 2017-12-18: 50 mg via ORAL
  Filled 2017-12-18: qty 1

## 2017-12-18 NOTE — ED Notes (Signed)
Patient states he last took tylenol at 1200 today.

## 2017-12-18 NOTE — ED Provider Notes (Signed)
[facility] EMERGENCY DEPARTMENT Provider Note   CSN: [id number] Arrival date & time: 12/18/17  1722     History   Chief Complaint Chief Complaint  Patient presents with  . Fever  . Generalized Body Aches    HPI Villa Herbndre L Bonfiglio is a 26 y.o. male.  Patient complains of fever nausea body aches and cough for a week.  He seems to be getting better.   The history is provided by the patient. No language interpreter was used.  Fever   This is a new problem. The current episode started 2 days ago. The problem occurs constantly. The problem has been gradually improving. The maximum temperature noted was 100 to 100.9 F. Associated symptoms include muscle aches and cough. Pertinent negatives include no chest pain, no diarrhea, no congestion and no headaches. He has tried nothing for the symptoms.    History reviewed. No pertinent past medical history.  There are no active problems to display for this patient.   History reviewed. No pertinent surgical history.     Home Medications    Prior to Admission medications   Medication Sig Start Date End Date Taking? Authorizing Provider  acetaminophen (TYLENOL) 500 MG tablet Take 500 mg by mouth every 6 (six) hours as needed for mild pain or moderate pain.   Yes [provider]  promethazine (PHENERGAN) 25 MG tablet Take 1 tablet (25 mg total) by mouth every 6 (six) hours as needed for nausea or vomiting. 12/18/17   Bethann BerkshireZammit, Meghann [place], MD  traMADol (ULTRAM) 50 MG tablet Take 1 tablet (50 mg total) by mouth every 6 (six) hours as needed. 12/18/17   Bethann BerkshireZammit, Keidan Aumiller, MD    Family History Family History  Problem Relation Age of Onset  . Diabetes Mother   . Diabetes Other     Social History Social History   Tobacco Use  . Smoking status: Current Every Day Smoker    Packs/day: 2.00    Types: Cigarettes  . Smokeless tobacco: Never Used  Substance Use Topics  . Alcohol use: No  . Drug use: Yes    Types: Cocaine    Comment:  last use crack 2 days ago     Allergies   Ibuprofen   Review of Systems Review of Systems  Constitutional: Positive for fever. Negative for appetite change and fatigue.  HENT: Negative for congestion, ear discharge and sinus pressure.   Eyes: Negative for discharge.  Respiratory: Positive for cough.   Cardiovascular: Negative for chest pain.  Gastrointestinal: Negative for abdominal pain and diarrhea.  Genitourinary: Negative for frequency and hematuria.  Musculoskeletal: Negative for back pain.  Skin: Negative for rash.  Neurological: Negative for seizures and headaches.  Psychiatric/Behavioral: Negative for hallucinations.     Physical Exam Updated Vital Signs BP 109/88 (BP Location: Left Arm)   Pulse 86   Temp 98.7 F (37.1 C) (Oral)   Resp 16   SpO2 100%   Physical Exam  Constitutional: He is oriented to person, place, and time. He appears well-developed.  HENT:  Head: Normocephalic.  Eyes: Conjunctivae and EOM are normal. No scleral icterus.  Neck: Neck supple. No thyromegaly present.  Cardiovascular: Normal rate and regular rhythm. Exam reveals no gallop and no friction rub.  No murmur heard. Pulmonary/Chest: No stridor. He has no wheezes. He has no rales. He exhibits no tenderness.  Abdominal: He exhibits no distension. There is no tenderness. There is no rebound.  Musculoskeletal: Normal range of motion. He exhibits no edema.  Lymphadenopathy:    He has no cervical adenopathy.  Neurological: He is oriented to person, place, and time. He exhibits normal muscle tone. Coordination normal.  Skin: No rash noted. No erythema.  Psychiatric: He has a normal mood and affect. His behavior is normal.     ED Treatments / Results  Labs (all labs ordered are listed, but only abnormal results are displayed) Labs Reviewed  COMPREHENSIVE METABOLIC PANEL - Abnormal; Notable for the following components:      Result Value   Calcium 8.7 (*)    Total Protein 6.4 (*)     Albumin 3.2 (*)    AST 59 (*)    ALT 65 (*)    All other components within normal limits  CBC WITH DIFFERENTIAL/PLATELET    EKG  EKG Interpretation None       Radiology Dg Chest 2 View  Result Date: 12/18/2017 CLINICAL DATA:  One-week history of cough, fever, chest pain, generalized weakness and myalgias. EXAM: CHEST  2 VIEW COMPARISON:  07/02/2017, 02/16/2016. FINDINGS: Cardiomediastinal silhouette unremarkable, unchanged. Lungs clear. Bronchovascular markings normal. Pulmonary vascularity normal. No visible pleural effusions. No pneumothorax. Visualized bony thorax intact. IMPRESSION: Normal examination. Electronically Signed   By: Hulan Saashomas  Lawrence M.D.   On: 12/18/2017 18:34    Procedures Procedures (including critical care time)  Medications Ordered in ED Medications  traMADol (ULTRAM) tablet 50 mg (50 mg Oral Given 12/18/17 1757)     Initial Impression / Assessment and Plan / ED Course  I have reviewed the triage vital signs and the nursing notes.  Pertinent labs & imaging results that were available during my care of the patient were reviewed by me and considered in my medical decision making (see chart for details).    Labs and chest x-ray unremarkable.  Suspect viral syndrome patient will drink plenty of fluids and follow-up if not improving  Final Clinical Impressions(s) / ED Diagnoses   Final diagnoses:  Influenza-like illness    ED Discharge Orders        Ordered    traMADol (ULTRAM) 50 MG tablet  Every 6 hours PRN     12/18/17 2005    promethazine (PHENERGAN) 25 MG tablet  Every 6 hours PRN     12/18/17 Anne [id number]       Bryar Rennie, MD 12/18/17 2008

## 2017-12-18 NOTE — ED Triage Notes (Signed)
Patient complaining of cough, fever, and body aches x 1 week.

## 2017-12-18 NOTE — Discharge Instructions (Signed)
Drink plenty of fluids and take Tylenol for fever.  Follow-up with the family doctor within the next month for recheck your blood pressure

## 2018-02-21 ENCOUNTER — Encounter (HOSPITAL_COMMUNITY): Payer: Self-pay | Admitting: *Deleted

## 2018-02-21 ENCOUNTER — Emergency Department (HOSPITAL_COMMUNITY)
Admission: EM | Admit: 2018-02-21 | Discharge: 2018-02-21 | Disposition: A | Discharge status: Home / Self Care | Payer: Self-pay | Attending: Emergency Medicine | Admitting: Emergency Medicine

## 2018-02-21 ENCOUNTER — Emergency Department (HOSPITAL_COMMUNITY): Payer: Self-pay

## 2018-02-21 DIAGNOSIS — R197 Diarrhea, unspecified: Secondary | ICD-10-CM | POA: Insufficient documentation

## 2018-02-21 DIAGNOSIS — B9789 Other viral agents as the cause of diseases classified elsewhere: Secondary | ICD-10-CM

## 2018-02-21 DIAGNOSIS — Z79899 Other long term (current) drug therapy: Secondary | ICD-10-CM | POA: Insufficient documentation

## 2018-02-21 DIAGNOSIS — F17210 Nicotine dependence, cigarettes, uncomplicated: Secondary | ICD-10-CM | POA: Insufficient documentation

## 2018-02-21 DIAGNOSIS — J988 Other specified respiratory disorders: Secondary | ICD-10-CM

## 2018-02-21 DIAGNOSIS — J989 Respiratory disorder, unspecified: Secondary | ICD-10-CM | POA: Insufficient documentation

## 2018-02-21 DIAGNOSIS — F1410 Cocaine abuse, uncomplicated: Secondary | ICD-10-CM | POA: Insufficient documentation

## 2018-02-21 DIAGNOSIS — B349 Viral infection, unspecified: Secondary | ICD-10-CM | POA: Insufficient documentation

## 2018-02-21 LAB — RAPID STREP SCREEN (MED CTR MEBANE ONLY): Streptococcus, Group A Screen (Direct): NEGATIVE

## 2018-02-21 NOTE — ED Notes (Signed)
Patient transported to X-ray 

## 2018-02-21 NOTE — ED Notes (Signed)
Pt states his congestion and throat affect him worse at night and it is difficult to sleep/breath.

## 2018-02-21 NOTE — ED Provider Notes (Signed)
[facility] EMERGENCY DEPARTMENT Provider Note   CSN: [id number] Arrival date & time: 02/21/18  1856     History   Chief Complaint Chief Complaint  Patient presents with  . Diarrhea    HPI DEMARKO ZEIMET is a 27 y.o. male.  Complains of nasal congestion cough and sneeze for the past 2 weeks.  Also with diarrhea for the past 2 days.  Had 3 or 4 episodes of diarrhea per day.  No known fever.  No treatment prior to coming here.  No vomiting.  Nothing makes symptoms better or worse.  His children have similar symptoms.  Denies shortness of breath.  HPI  History reviewed. No pertinent past medical history. Past medical history negative There are no active problems to display for this patient.   History reviewed. No pertinent surgical history.     Home Medications    Prior to Admission medications   Medication Sig Start Date End Date Taking? Authorizing Provider  acetaminophen (TYLENOL) 500 MG tablet Take 500 mg by mouth every 6 (six) hours as needed for mild pain or moderate pain.    [provider]  promethazine (PHENERGAN) 25 MG tablet Take 1 tablet (25 mg total) by mouth every 6 (six) hours as needed for nausea or vomiting. 12/18/17   Bethann Berkshire, MD  traMADol (ULTRAM) 50 MG tablet Take 1 tablet (50 mg total) by mouth every 6 (six) hours as needed. 12/18/17   Bethann Berkshire, MD    Family History Family History  Problem Relation Age of Onset  . Diabetes Mother   . Diabetes Other     Social History Social History   Tobacco Use  . Smoking status: Current Every Day Smoker    Packs/day: 2.00    Types: Cigarettes  . Smokeless tobacco: Never Used  Substance Use Topics  . Alcohol use: No  . Drug use: Yes    Types: Cocaine    Comment: last used 2 days ago   Smokes crack.  No history of IV drug use  Allergies   Ibuprofen   Review of Systems Review of Systems  HENT: Positive for congestion and sneezing.   Respiratory: Positive for cough.     Cardiovascular: Negative.   Gastrointestinal: Positive for diarrhea.  Musculoskeletal: Negative.   Skin: Negative.   Neurological: Positive for weakness.       Generalized weakness  Psychiatric/Behavioral: Negative.   All other systems reviewed and are negative.    Physical Exam Updated Vital Signs BP 119/81 (BP Location: Right Arm)   Pulse 70   Temp 98.1 F (36.7 C) (Oral)   Resp 16   Ht 6' (1.829 m)   Wt 72.1 kg (159 lb)   SpO2 99%   BMI 21.56 kg/m   Physical Exam  Constitutional: He is oriented to person, place, and time. He appears well-developed and well-nourished. No distress.  HENT:  Head: Normocephalic and atraumatic.  Right Ear: External ear normal.  Left Ear: External ear normal.  Bilateral tympanic membranes normal.  Nasal congestion present.  Oropharynx normal.  Eyes: Conjunctivae are normal. Pupils are equal, round, and reactive to light.  Neck: Neck supple. No tracheal deviation present. No thyromegaly present.  Cardiovascular: Normal rate and regular rhythm.  No murmur heard. Pulmonary/Chest: Effort normal and breath sounds normal.  Abdominal: Soft. Bowel sounds are normal. He exhibits no distension. There is no tenderness.  Musculoskeletal: Normal range of motion. He exhibits no edema or tenderness.  Neurological: He is alert and oriented  to person, place, and time. Coordination normal.  Gait normal.  Cranial nerves II through XII grossly intact.  Skin: Skin is warm and dry. No rash noted.  Psychiatric: He has a normal mood and affect.  Nursing note and vitals reviewed.    ED Treatments / Results  Labs (all labs ordered are listed, but only abnormal results are displayed) Labs Reviewed  RAPID STREP SCREEN (NOT AT [facility])  CULTURE, GROUP A STREP [facility])    EKG  EKG Interpretation None       Radiology Dg Chest 2 View  Result Date: 02/21/2018 CLINICAL DATA:  Cough EXAM: CHEST  2 VIEW COMPARISON:  12/18/2017 FINDINGS: The heart size and  mediastinal contours are within normal limits. Both lungs are clear. The visualized skeletal structures are unremarkable. IMPRESSION: No active cardiopulmonary disease. Electronically Signed   By: Jasmine PangKim  Fujinaga M.D.   On: 02/21/2018 21:44    Procedures Procedures (including critical care time)  Medications Ordered in ED Medications - No data to display   Initial Impression / Assessment and Plan / ED Course  I have reviewed the triage vital signs and the nursing notes.  Pertinent labs & imaging results that were available during my care of the patient were reviewed by me and considered in my medical decision making (see chart for details).     Symptoms consistent with viral illness.  Plan encourage oral hydration.  Mucinex, Imodium as directed.  Avoid dairy.  I counseled patient for 5 minutes on smoking cessation.  [facility]  Final Clinical Impressions(s) / ED Diagnoses   #1 viral respiratory illness #2 diarrhea #3 tobacco abuse #4 cocaine abuse Final diagnoses:  None    ED Discharge Orders    None       Doug SouJacubowitz, Jeron Grahn, MD 02/21/18 2221

## 2018-02-21 NOTE — ED Triage Notes (Signed)
Pt with diarrhea, runny nose, cough productive at times, feeling hot, sore throat mild, denies N/V.  Ongoing for a week, children have been sick.

## 2018-02-21 NOTE — Discharge Instructions (Signed)
Take Mucinex as needed for nasal congestion.  Take Imodium as directed for diarrhea.  Avoid milk or foods containing milk such as cheese or ice cream while having diarrhea.Make sure that you drink at least six 8 ounce glasses of water or Gatorade each day in order to stay well-hydrated.  Ask your doctor to help you to stop smoking.  See your doctor if not feeling better in a week.  Return if concern for any reason

## 2018-02-24 LAB — CULTURE, GROUP A STREP (THRC)

## 2018-03-03 ENCOUNTER — Emergency Department (HOSPITAL_COMMUNITY): Payer: Self-pay

## 2018-03-03 ENCOUNTER — Encounter (HOSPITAL_COMMUNITY): Payer: Self-pay | Admitting: *Deleted

## 2018-03-03 ENCOUNTER — Emergency Department (HOSPITAL_COMMUNITY)
Admission: EM | Admit: 2018-03-03 | Discharge: 2018-03-03 | Payer: Self-pay | Attending: Emergency Medicine | Admitting: Emergency Medicine

## 2018-03-03 ENCOUNTER — Other Ambulatory Visit: Payer: Self-pay

## 2018-03-03 DIAGNOSIS — S63501A Unspecified sprain of right wrist, initial encounter: Secondary | ICD-10-CM | POA: Insufficient documentation

## 2018-03-03 DIAGNOSIS — Y939 Activity, unspecified: Secondary | ICD-10-CM | POA: Insufficient documentation

## 2018-03-03 DIAGNOSIS — W500XXA Accidental hit or strike by another person, initial encounter: Secondary | ICD-10-CM | POA: Insufficient documentation

## 2018-03-03 DIAGNOSIS — T1490XA Injury, unspecified, initial encounter: Secondary | ICD-10-CM

## 2018-03-03 DIAGNOSIS — F17210 Nicotine dependence, cigarettes, uncomplicated: Secondary | ICD-10-CM | POA: Insufficient documentation

## 2018-03-03 DIAGNOSIS — Y929 Unspecified place or not applicable: Secondary | ICD-10-CM | POA: Insufficient documentation

## 2018-03-03 DIAGNOSIS — Y999 Unspecified external cause status: Secondary | ICD-10-CM | POA: Insufficient documentation

## 2018-03-03 MED ORDER — ACETAMINOPHEN 325 MG PO TABS
650.0000 mg | ORAL_TABLET | Freq: Once | ORAL | Status: AC
  Administered 2018-03-03: 650 mg via ORAL
  Filled 2018-03-03: qty 2
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
## 2018-07-17 ENCOUNTER — Other Ambulatory Visit: Payer: Self-pay

## 2018-07-17 ENCOUNTER — Emergency Department (HOSPITAL_COMMUNITY)
Admission: EM | Admit: 2018-07-17 | Discharge: 2018-07-17 | Disposition: A | Discharge status: Home / Self Care | Payer: Self-pay | Attending: Emergency Medicine | Admitting: Emergency Medicine

## 2018-07-17 ENCOUNTER — Emergency Department (HOSPITAL_COMMUNITY): Payer: Self-pay

## 2018-07-17 ENCOUNTER — Encounter (HOSPITAL_COMMUNITY): Payer: Self-pay | Admitting: Emergency Medicine

## 2018-07-17 DIAGNOSIS — Y939 Activity, unspecified: Secondary | ICD-10-CM | POA: Insufficient documentation

## 2018-07-17 DIAGNOSIS — R102 Pelvic and perineal pain: Secondary | ICD-10-CM | POA: Insufficient documentation

## 2018-07-17 DIAGNOSIS — S00512A Abrasion of oral cavity, initial encounter: Secondary | ICD-10-CM | POA: Insufficient documentation

## 2018-07-17 DIAGNOSIS — S0083XA Contusion of other part of head, initial encounter: Secondary | ICD-10-CM | POA: Insufficient documentation

## 2018-07-17 DIAGNOSIS — Y999 Unspecified external cause status: Secondary | ICD-10-CM | POA: Insufficient documentation

## 2018-07-17 DIAGNOSIS — Z23 Encounter for immunization: Secondary | ICD-10-CM | POA: Insufficient documentation

## 2018-07-17 DIAGNOSIS — F17210 Nicotine dependence, cigarettes, uncomplicated: Secondary | ICD-10-CM | POA: Insufficient documentation

## 2018-07-17 DIAGNOSIS — R0789 Other chest pain: Secondary | ICD-10-CM | POA: Insufficient documentation

## 2018-07-17 DIAGNOSIS — M542 Cervicalgia: Secondary | ICD-10-CM | POA: Insufficient documentation

## 2018-07-17 DIAGNOSIS — S0990XA Unspecified injury of head, initial encounter: Secondary | ICD-10-CM

## 2018-07-17 DIAGNOSIS — Y929 Unspecified place or not applicable: Secondary | ICD-10-CM | POA: Insufficient documentation

## 2018-07-17 DIAGNOSIS — S50311A Abrasion of right elbow, initial encounter: Secondary | ICD-10-CM | POA: Insufficient documentation

## 2018-07-17 DIAGNOSIS — R1031 Right lower quadrant pain: Secondary | ICD-10-CM | POA: Insufficient documentation

## 2018-07-17 DIAGNOSIS — R55 Syncope and collapse: Secondary | ICD-10-CM | POA: Insufficient documentation

## 2018-07-17 DIAGNOSIS — T148XXA Other injury of unspecified body region, initial encounter: Secondary | ICD-10-CM

## 2018-07-17 LAB — I-STAT CHEM 8, ED
BUN: 11 mg/dL (ref 6–20)
Calcium, Ion: 1.23 mmol/L (ref 1.15–1.40)
Chloride: 103 mmol/L (ref 98–111)
Creatinine, Ser: 1 mg/dL (ref 0.61–1.24)
Glucose, Bld: 53 mg/dL — ABNORMAL LOW (ref 70–99)
HCT: 49 % (ref 39.0–52.0)
Hemoglobin: 16.7 g/dL (ref 13.0–17.0)
Potassium: 3.6 mmol/L (ref 3.5–5.1)
Sodium: 142 mmol/L (ref 135–145)
TCO2: 25 mmol/L (ref 22–32)

## 2018-07-17 MED ORDER — PENICILLIN V POTASSIUM 250 MG PO TABS: 250.0000 mg | ORAL_TABLET | Freq: Four times a day (QID) | ORAL | 0 refills | Status: DC

## 2018-07-17 MED ORDER — IOPAMIDOL (ISOVUE-300) INJECTION 61%
100.0000 mL | Freq: Once | INTRAVENOUS | Status: AC | PRN
  Administered 2018-07-17: 100 mL via INTRAVENOUS

## 2018-07-17 MED ORDER — HYDROCODONE-ACETAMINOPHEN 5-325 MG PO TABS: ORAL_TABLET | ORAL | 0 refills | Status: DC

## 2018-07-17 MED ORDER — TETANUS-DIPHTH-ACELL PERTUSSIS 5-2.5-18.5 LF-MCG/0.5 IM SUSP
0.5000 mL | Freq: Once | INTRAMUSCULAR | Status: AC
  Administered 2018-07-17: 0.5 mL via INTRAMUSCULAR
  Filled 2018-07-17: qty 0.5

## 2018-07-17 MED ORDER — METHOCARBAMOL 500 MG PO TABS: 1000.0000 mg | ORAL_TABLET | Freq: Four times a day (QID) | ORAL | 0 refills | Status: DC | PRN
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
## 2018-07-17 NOTE — Discharge Instructions (Signed)
Take the prescriptions as directed.  Apply moist heat or ice to the area(s) of discomfort, for 15 minutes at a time, several times per day for the next few days.  Do not fall asleep on a heating or ice pack. Wash the abraded area(s) gently with soap and water, and pat dry, at least twice a day, and cover with a clean/dry dressing.  Change the dressing whenever it becomes wet or soiled after washing the area with soap and water and patting dry.  Call your regular medical doctor today to schedule a follow up appointment next week.  Return to the Emergency Department immediately if worsening.
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
## 2019-01-07 ENCOUNTER — Encounter (HOSPITAL_COMMUNITY): Payer: Self-pay | Admitting: Emergency Medicine

## 2019-01-07 ENCOUNTER — Other Ambulatory Visit: Payer: Self-pay

## 2019-01-07 ENCOUNTER — Emergency Department (HOSPITAL_COMMUNITY)
Admission: EM | Admit: 2019-01-07 | Discharge: 2019-01-07 | Disposition: A | Discharge status: Home / Self Care | Payer: Self-pay | Attending: Emergency Medicine | Admitting: Emergency Medicine

## 2019-01-07 DIAGNOSIS — Z79899 Other long term (current) drug therapy: Secondary | ICD-10-CM | POA: Insufficient documentation

## 2019-01-07 DIAGNOSIS — F17210 Nicotine dependence, cigarettes, uncomplicated: Secondary | ICD-10-CM | POA: Insufficient documentation

## 2019-01-07 DIAGNOSIS — K625 Hemorrhage of anus and rectum: Secondary | ICD-10-CM | POA: Insufficient documentation

## 2019-01-07 DIAGNOSIS — R197 Diarrhea, unspecified: Secondary | ICD-10-CM

## 2019-01-07 LAB — COMPREHENSIVE METABOLIC PANEL
ALT: 54 U/L — ABNORMAL HIGH (ref 0–44)
AST: 36 U/L (ref 15–41)
Albumin: 4.9 g/dL (ref 3.5–5.0)
Alkaline Phosphatase: 51 U/L (ref 38–126)
Anion gap: 8 (ref 5–15)
BUN: 17 mg/dL (ref 6–20)
CO2: 26 mmol/L (ref 22–32)
Calcium: 9.3 mg/dL (ref 8.9–10.3)
Chloride: 106 mmol/L (ref 98–111)
Creatinine, Ser: 1.11 mg/dL (ref 0.61–1.24)
GFR calc Af Amer: >60 mL/min (ref >60)
GFR calc non Af Amer: >60 mL/min (ref >60)
Glucose, Bld: 128 mg/dL — ABNORMAL HIGH (ref 70–99)
Potassium: 3.7 mmol/L (ref 3.5–5.1)
Sodium: 140 mmol/L (ref 135–145)
Total Bilirubin: 0.7 mg/dL (ref 0.3–1.2)
Total Protein: 7.8 g/dL (ref 6.5–8.1)

## 2019-01-07 LAB — CBC
HCT: 44 % (ref 39.0–52.0)
Hemoglobin: 14.2 g/dL (ref 13.0–17.0)
MCH: 27.2 pg (ref 26.0–34.0)
MCHC: 32.3 g/dL (ref 30.0–36.0)
MCV: 84.1 fL (ref 80.0–100.0)
Platelets: 291 10*3/uL (ref 150–400)
RBC: 5.23 MIL/uL (ref 4.22–5.81)
RDW: 13.2 % (ref 11.5–15.5)
WBC: 5.6 10*3/uL (ref 4.0–10.5)
nRBC: 0 % (ref 0.0–0.2)

## 2019-01-07 LAB — C DIFFICILE QUICK SCREEN W PCR REFLEX
C Diff antigen: NEGATIVE
C Diff interpretation: NOT DETECTED
C Diff toxin: NEGATIVE

## 2019-01-07 LAB — POC OCCULT BLOOD, ED: Fecal Occult Bld: POSITIVE — AB

## 2019-01-07 LAB — LIPASE, BLOOD: Lipase: 44 U/L (ref 11–51)

## 2019-01-07 MED ORDER — SODIUM CHLORIDE 0.9 % IV BOLUS
1000.0000 mL | Freq: Once | INTRAVENOUS | Status: AC
  Administered 2019-01-07: 1000 mL via INTRAVENOUS

## 2019-01-07 NOTE — Discharge Instructions (Signed)
It was my pleasure taking care of you today!   As we discussed, it is very important that you call with the GI clinic in the morning to arrange a follow up appointment.   Return to ER for worsening of your bleeding, new or worsening symptoms, any additional concerns.

## 2019-01-07 NOTE — ED Triage Notes (Signed)
Patient c/o diarrhea x2 months. Reports last week he noticed "spots" of blood in stool. Today states one episode of bright red blood in stool. Reports lower abdominal pain.

## 2019-01-07 NOTE — ED Provider Notes (Signed)
[facility]-EMERGENCY DEPT Provider Note   CSN: [id number] Arrival date & time: 01/07/19  1357     History   Chief Complaint Chief Complaint  Patient presents with  . Rectal Bleeding  . Diarrhea    HPI Hunter Andrews is a 28 y.o. male.  The history is provided by the patient and medical records. No language interpreter was used.                                      Hunter Andrews is a 28 y.o. male with no known PMH who presents to the Emergency Department complaining of diarrhea for the last 2 months.  Patient states that he has been having 2-3 loose stools a day for the last 2 months.  There was one day in the first week or so, that stool had a green color, but it is no longer like this, currently a light brown.  About a week ago, he was wiping after having a bowel movement and noticed some spotting on toilet paper.  This was persistent for about 2 days and then resolved.  This morning, he had a bowel movement and noticed blood dripping in the toilet.  Unsure how much, but states that it was more significant than it had been.  Prior to 2 months ago, he denies any history of similar.  No antibiotic use prior to onset of diarrhea.  He did have a tooth pulled a few weeks ago and was on antibiotics for that, however this was in the middle of his current symptomology.  No recent travel, well water, camping, etc.  No known sick contacts.  No fever or chills.  Intermittently having some nausea and lower abdominal pain, but not currently.  Does feel more weak and tired than usual.                                                                                 History reviewed. No pertinent past medical history.  There are no active problems to display for this patient.   History reviewed. No pertinent surgical history.      Home Medications    Prior to Admission medications   Medication Sig Start Date End Date Taking? Authorizing Provider   Multiple Vitamins-Minerals (MULTIVITAMIN ADULT) TABS Take 1 tablet by mouth daily.   Yes [provider]  HYDROcodone-acetaminophen (NORCO/VICODIN) 5-325 MG tablet 1 or 2 tabs PO q6 hours prn pain Patient not taking: Reported on 01/07/2019 07/17/18   Samuel Jester, DO  methocarbamol (ROBAXIN) 500 MG tablet Take 2 tablets (1,000 mg total) by mouth 4 (four) times daily as needed for muscle spasms (muscle spasm/pain). Patient not taking: Reported on 01/07/2019 07/17/18   Samuel Jester, DO  penicillin v potassium (VEETID) 250 MG tablet Take 1 tablet (250 mg total) by mouth 4 (four) times daily. Patient not taking: Reported on 01/07/2019 07/17/18   Samuel Jester, DO    Family History Family History  Problem Relation Age of Onset  . Diabetes Mother   . Diabetes Other     Social History Social  History   Tobacco Use  . Smoking status: Current Every Day Smoker    Packs/day: 2.00    Types: Cigarettes  . Smokeless tobacco: Never Used  Substance Use Topics  . Alcohol use: Yes    Comment: occ  . Drug use: Yes    Types: Cocaine, Marijuana    Comment: last smoked marijuana last night     Allergies   Ibuprofen   Review of Systems Review of Systems  Gastrointestinal: Positive for abdominal pain, anal bleeding, blood in stool, diarrhea and nausea. Negative for constipation, rectal pain and vomiting.  All other systems reviewed and are negative.    Physical Exam Updated Vital Signs BP 139/88 (BP Location: Left Arm)   Pulse 68   Temp 98.5 F (36.9 C) (Oral)   Resp 16   Ht 6' (1.829 m)   Wt 94.8 kg   SpO2 99%   BMI 28.35 kg/m   Physical Exam Vitals signs and nursing note reviewed.  Constitutional:      General: He is not in acute distress.    Appearance: He is well-developed.     Comments: Nontoxic-appearing.  HENT:     Head: Normocephalic and atraumatic.  Cardiovascular:     Rate and Rhythm: Normal rate and regular rhythm.     Heart sounds: Normal heart  sounds. No murmur.  Pulmonary:     Effort: Pulmonary effort is normal. No respiratory distress.     Breath sounds: Normal breath sounds.  Abdominal:     General: There is no distension.     Palpations: Abdomen is soft.     Comments: No abdominal tenderness.  Genitourinary:    Comments: No frank bleeding.  Light brown stool.  No overt tenderness. Skin:    General: Skin is warm and dry.  Neurological:     Mental Status: He is alert and oriented to person, place, and time.      ED Treatments / Results  Labs (all labs ordered are listed, but only abnormal results are displayed) Labs Reviewed  COMPREHENSIVE METABOLIC PANEL - Abnormal; Notable for the following components:      Result Value   Glucose, Bld 128 (*)    ALT 54 (*)    All other components within normal limits  POC OCCULT BLOOD, ED - Abnormal; Notable for the following components:   Fecal Occult Bld POSITIVE (*)    All other components within normal limits  C DIFFICILE QUICK SCREEN W PCR REFLEX  GASTROINTESTINAL PANEL BY PCR, STOOL (REPLACES STOOL CULTURE)  LIPASE, BLOOD  CBC  URINALYSIS, ROUTINE W REFLEX MICROSCOPIC    EKG None  Radiology No results found.  Procedures Procedures (including critical care time)  Medications Ordered in ED Medications  sodium chloride 0.9 % bolus 1,000 mL (0 mLs Intravenous Stopped 01/07/19 1926)     Initial Impression / Assessment and Plan / ED Course  I have reviewed the triage vital signs and the nursing notes.  Pertinent labs & imaging results that were available during my care of the patient were reviewed by me and considered in my medical decision making (see chart for details).    Hunter Andrews is a 28 y.o. male who presents to ED for diarrhea for the last 2 months with recent blood in the stool noted over the last week.  On exam, patient is afebrile, hemodynamically stable with benign abdominal exam.  He did not have any overt bleeding on exam.  His stool was light  brown, but  was Hemoccult positive.  His hemoglobin today is 14.2.  C. difficile negative.  GI panel sent for testing.  Discussed results with patient at length.  He will indeed need GI outpatient work-up, but appears stable with no acute emergent work-up needed.  We discussed reasons to return to the emergency department at length.  Patient understands and agrees with plan for GI panel and GI referral.  All questions answered.  Patient discussed with Dr. Lynelle DoctorKnapp who agrees with treatment plan.    Final Clinical Impressions(s) / ED Diagnoses   Final diagnoses:  Rectal bleeding  Diarrhea, unspecified type    ED Discharge Orders    None       Kenner Lewan, Chase PicketJaime Pilcher, PA-C 01/07/19 1949    Linwood DibblesKnapp, Jon, MD 01/07/19 2358

## 2019-01-08 LAB — GASTROINTESTINAL PANEL BY PCR, STOOL (REPLACES STOOL CULTURE)

## 2019-06-05 ENCOUNTER — Encounter (HOSPITAL_COMMUNITY): Payer: Self-pay

## 2019-06-05 ENCOUNTER — Emergency Department (HOSPITAL_COMMUNITY)
Admission: EM | Admit: 2019-06-05 | Discharge: 2019-06-06 | Disposition: A | Discharge status: Home / Self Care | Payer: Self-pay | Attending: Emergency Medicine | Admitting: Emergency Medicine

## 2019-06-05 ENCOUNTER — Emergency Department (HOSPITAL_COMMUNITY): Payer: Self-pay

## 2019-06-05 DIAGNOSIS — R21 Rash and other nonspecific skin eruption: Secondary | ICD-10-CM | POA: Insufficient documentation

## 2019-06-05 DIAGNOSIS — F17210 Nicotine dependence, cigarettes, uncomplicated: Secondary | ICD-10-CM | POA: Insufficient documentation

## 2019-06-05 DIAGNOSIS — R079 Chest pain, unspecified: Secondary | ICD-10-CM

## 2019-06-05 DIAGNOSIS — R1084 Generalized abdominal pain: Secondary | ICD-10-CM

## 2019-06-05 DIAGNOSIS — R1011 Right upper quadrant pain: Secondary | ICD-10-CM | POA: Insufficient documentation

## 2019-06-05 DIAGNOSIS — Z79899 Other long term (current) drug therapy: Secondary | ICD-10-CM | POA: Insufficient documentation

## 2019-06-05 LAB — CBC
HCT: 41.8 % (ref 39.0–52.0)
Hemoglobin: 13.6 g/dL (ref 13.0–17.0)
MCH: 27.2 pg (ref 26.0–34.0)
MCHC: 32.5 g/dL (ref 30.0–36.0)
MCV: 83.6 fL (ref 80.0–100.0)
Platelets: 302 10*3/uL (ref 150–400)
RBC: 5 MIL/uL (ref 4.22–5.81)
RDW: 13 % (ref 11.5–15.5)
WBC: 7.5 10*3/uL (ref 4.0–10.5)
nRBC: 0 % (ref 0.0–0.2)

## 2019-06-05 LAB — BASIC METABOLIC PANEL
Anion gap: 13 (ref 5–15)
BUN: 13 mg/dL (ref 6–20)
CO2: 18 mmol/L — ABNORMAL LOW (ref 22–32)
Calcium: 9.5 mg/dL (ref 8.9–10.3)
Chloride: 110 mmol/L (ref 98–111)
Creatinine, Ser: 1.07 mg/dL (ref 0.61–1.24)
GFR calc Af Amer: >60 mL/min (ref >60)
GFR calc non Af Amer: >60 mL/min (ref >60)
Glucose, Bld: 78 mg/dL (ref 70–99)
Potassium: 3.9 mmol/L (ref 3.5–5.1)
Sodium: 141 mmol/L (ref 135–145)

## 2019-06-05 LAB — LIPASE, BLOOD: Lipase: 47 U/L (ref 11–51)

## 2019-06-05 LAB — URINALYSIS, ROUTINE W REFLEX MICROSCOPIC
Bilirubin Urine: NEGATIVE
Glucose, UA: NEGATIVE mg/dL
Hgb urine dipstick: NEGATIVE
Ketones, ur: NEGATIVE mg/dL
Leukocytes,Ua: NEGATIVE
Nitrite: NEGATIVE
Protein, ur: NEGATIVE mg/dL
Specific Gravity, Urine: 1.031 — ABNORMAL HIGH (ref 1.005–1.030)
pH: 5 (ref 5.0–8.0)

## 2019-06-05 LAB — HEPATIC FUNCTION PANEL
ALT: 51 U/L — ABNORMAL HIGH (ref 0–44)
AST: 35 U/L (ref 15–41)
Albumin: 4 g/dL (ref 3.5–5.0)
Alkaline Phosphatase: 59 U/L (ref 38–126)
Bilirubin, Direct: 0.1 mg/dL (ref 0.0–0.2)
Indirect Bilirubin: 0.5 mg/dL (ref 0.3–0.9)
Total Bilirubin: 0.6 mg/dL (ref 0.3–1.2)
Total Protein: 7.1 g/dL (ref 6.5–8.1)

## 2019-06-05 LAB — TROPONIN I: Troponin I: <0.03 ng/mL (ref <0.03)

## 2019-06-05 NOTE — ED Triage Notes (Signed)
Pt states that he has upper abd pain that radiates to his back and chest area, causes SOB with movement, denies urinary symptoms, denies n/v, denies heavy lifting

## 2019-06-06 ENCOUNTER — Emergency Department (HOSPITAL_COMMUNITY): Payer: Self-pay

## 2019-06-06 LAB — RAPID HIV SCREEN (HIV 1/2 AB+AG)
HIV 1/2 Antibodies: NONREACTIVE
HIV-1 P24 Antigen - HIV24: NONREACTIVE

## 2019-06-06 MED ORDER — KETOROLAC TROMETHAMINE 30 MG/ML IJ SOLN
30.0000 mg | Freq: Once | INTRAMUSCULAR | Status: AC
  Administered 2019-06-06: 30 mg via INTRAVENOUS
  Filled 2019-06-06: qty 1

## 2019-06-06 NOTE — Discharge Instructions (Addendum)
The cause of your symptoms remains unclear.  Your laboratory work-up and imaging tests are reassuring in the emergency department tonight.  Your ultrasound did show some mild fatty liver disease, which typically comes from alcohol use.  Please limit alcohol use.  Please discuss these results with your primary care doctor.  Please monitor your symptoms, and return for worsening pain or symptoms.  Particularly, pay attention to fever, productive cough, or shortness of breath.

## 2019-06-06 NOTE — ED Provider Notes (Signed)
[facility]   CSN: [id number] Arrival date & time: 06/05/19  2157    History   Chief Complaint Chief Complaint  Patient presents with  . Abdominal Pain  . Chest Pain    HPI Villa Herbndre L Monnier is a 28 y.o. male.     Patient presents to the ED with a chief complaint of upper abdominal pain.  He states that the pain started about a day ago.  He states that the pain radiates to his back.  It is worsened with palpation.  He denies any fever, chills, n/v/d.  Denies any flank pain or dysuria/hematuria.  Has tried ibuprofen with little relief.  The history is provided by the patient. No language interpreter was used.  Chest Pain    History reviewed. No pertinent past medical history.  There are no active problems to display for this patient.   History reviewed. No pertinent surgical history.      Home Medications    Prior to Admission medications   Medication Sig Start Date End Date Taking? Authorizing Provider  HYDROcodone-acetaminophen (NORCO/VICODIN) 5-325 MG tablet 1 or 2 tabs PO q6 hours prn pain Patient not taking: Reported on 01/07/2019 07/17/18   Samuel JesterMcManus, Kathleen, DO  methocarbamol (ROBAXIN) 500 MG tablet Take 2 tablets (1,000 mg total) by mouth 4 (four) times daily as needed for muscle spasms (muscle spasm/pain). Patient not taking: Reported on 01/07/2019 07/17/18   Samuel JesterMcManus, Kathleen, DO  Multiple Vitamins-Minerals (MULTIVITAMIN ADULT) TABS Take 1 tablet by mouth daily.    [provider]  penicillin v potassium (VEETID) 250 MG tablet Take 1 tablet (250 mg total) by mouth 4 (four) times daily. Patient not taking: Reported on 01/07/2019 07/17/18   Samuel JesterMcManus, Kathleen, DO    Family History Family History  Problem Relation Age of Onset  . Diabetes Mother   . Diabetes Other     Social History Social History   Tobacco Use  . Smoking status: Current Every Day Smoker    Packs/day: 2.00    Types: Cigarettes  .  Smokeless tobacco: Never Used  Substance Use Topics  . Alcohol use: Yes    Comment: occ  . Drug use: Yes    Types: Cocaine, Marijuana    Comment: last smoked marijuana last night     Allergies   Ibuprofen   Review of Systems Review of Systems  All other systems reviewed and are negative.    Physical Exam Updated Vital Signs BP 120/86 (BP Location: Right Arm)   Pulse (!) 52   Temp 97.8 F (36.6 C) (Oral)   Resp 20   Ht 6' (1.829 m)   SpO2 98%   BMI 28.35 kg/m   Physical Exam Vitals signs and nursing note reviewed.  Constitutional:      Appearance: He is well-developed.  HENT:     Head: Normocephalic and atraumatic.  Eyes:     Conjunctiva/sclera: Conjunctivae normal.  Neck:     Musculoskeletal: Neck supple.  Cardiovascular:     Rate and Rhythm: Normal rate and regular rhythm.     Heart sounds: No murmur.  Pulmonary:     Effort: Pulmonary effort is normal. No respiratory distress.     Breath sounds: Normal breath sounds.  Abdominal:     Palpations: Abdomen is soft.     Tenderness: There is abdominal tenderness in the right upper quadrant.  Skin:    General: Skin is warm and dry.  Neurological:  Mental Status: He is alert and oriented to person, place, and time.  Psychiatric:        Mood and Affect: Mood normal.        Behavior: Behavior normal.      ED Treatments / Results  Labs (all labs ordered are listed, but only abnormal results are displayed) Labs Reviewed  BASIC METABOLIC PANEL - Abnormal; Notable for the following components:      Result Value   CO2 18 (*)    All other components within normal limits  URINALYSIS, ROUTINE W REFLEX MICROSCOPIC - Abnormal; Notable for the following components:   APPearance HAZY (*)    Specific Gravity, Urine 1.031 (*)    All other components within normal limits  HEPATIC FUNCTION PANEL - Abnormal; Notable for the following components:   ALT 51 (*)    All other components within normal limits  CBC   TROPONIN I  LIPASE, BLOOD    EKG None  Radiology Dg Chest 2 View  Result Date: 06/05/2019 CLINICAL DATA:  Upper abdominal pain, shortness of breath EXAM: CHEST - 2 VIEW COMPARISON:  02/21/2018 FINDINGS: The heart size and mediastinal contours are within normal limits. Both lungs are clear. The visualized skeletal structures are unremarkable. IMPRESSION: No acute abnormality of the lungs. Electronically Signed   By: Eddie Candle M.D.   On: 06/05/2019 22:44    Procedures Procedures (including critical care time)  Medications Ordered in ED Medications  ketorolac (TORADOL) 30 MG/ML injection 30 mg (has no administration in time range)     Initial Impression / Assessment and Plan / ED Course  I have reviewed the triage vital signs and the nursing notes.  Pertinent labs & imaging results that were available during my care of the patient were reviewed by me and considered in my medical decision making (see chart for details).       Patient with mild chest pain and abdominal pain.  Symptoms started last night.  He does have some right upper abdominal tenderness to palpation, will check right upper quadrant ultrasound.  Laboratory work-up is reassuring.  Vital signs are stable.  He is not hypoxic nor tachycardic.  PERC negative, I doubt pulmonary embolism.  He is low risk for ACS.  Denies any recent illnesses, doubt pericarditis.  At this time, I do not feel that the patient has any emergent condition that requires admission to the hospital or emergent intervention.  I recommended that he follow-up with his primary care doctor, or return for new or worsening symptoms.  Patient is agreeable with this plan.  I did order tickborne illness panel due to the vague symptoms and some arthralgias some mild rash.  He has no fever.  He is not toxic in appearance.  Final Clinical Impressions(s) / ED Diagnoses   Final diagnoses:  Generalized abdominal pain  Chest pain, unspecified type    ED  Discharge Orders    None       Montine Circle, PA-C 06/06/19 0207    Ward, Delice Bison, DO 06/06/19 8546

## 2019-06-08 LAB — B. BURGDORFI ANTIBODIES: B burgdorferi Ab IgG+IgM: <0.91 {ISR} (ref 0.00–0.90)

## 2019-06-09 LAB — ROCKY MTN SPOTTED FVR ABS PNL(IGG+IGM)
RMSF IgG: NEGATIVE
RMSF IgM: 0.39 index (ref 0.00–0.89)

## 2020-01-05 ENCOUNTER — Other Ambulatory Visit: Payer: Self-pay

## 2020-01-05 DIAGNOSIS — Z20822 Contact with and (suspected) exposure to covid-19: Secondary | ICD-10-CM

## 2020-01-07 ENCOUNTER — Ambulatory Visit: Payer: Self-pay | Attending: Internal Medicine

## 2020-01-07 LAB — NOVEL CORONAVIRUS, NAA: SARS-CoV-2, NAA: NOT DETECTED

## 2020-01-08 ENCOUNTER — Telehealth: Payer: Self-pay

## 2020-01-08 NOTE — Telephone Encounter (Signed)

## 2020-03-20 NOTE — Progress Notes (Deleted)
    SUBJECTIVE:   CHIEF COMPLAINT / HPI:   PNC?  HM -HIV -flu  PERTINENT  PMH / PSH: ***  OBJECTIVE:   There were no vitals taken for this visit.  ***  ASSESSMENT/PLAN:   No problem-specific Assessment & Plan notes found for this encounter.     Mirian Mo, MD [facility]

## 2020-03-21 ENCOUNTER — Ambulatory Visit: Payer: Self-pay | Admitting: Family Medicine

## 2020-11-03 ENCOUNTER — Encounter: Payer: Self-pay | Admitting: *Deleted

## 2020-11-03 ENCOUNTER — Telehealth: Payer: Self-pay | Admitting: *Deleted

## 2020-11-03 NOTE — Congregational Nurse Program (Signed)
  Dept: [phone]   Congregational Nurse Program Note  Date of Encounter: 11/03/2020  Past Medical History: No past medical history on file.  Encounter Details:  CNP Questionnaire - 11/01/20 0850      Questionnaire   Do you give verbal consent to treat you today? Yes    Visit Setting Church or Organization    Location Patient Served At [facility]    Patient Status Homeless    Medical Provider No    Insurance Private Insurance    Intervention Refer;Support    Housing/Utilities No permanent housing    Transportation Need transportation assistance    Interpersonal Safety Do not feel physically and emotionally safe where you currently live    Food Have food insecurities    Medication Have medication insecurities    Referrals Behavioral/Mental Health Provider          Pt seen November 3rd at [facility] requesting help seeing mental health provider. Pt reports he used to go to [place] and does not have medications. He made an appt with [facility] but is unsure when appt is. He reports hx of anxiety, depression and PTSD. Left message with [facility] for return call. [facility] rep returned call and pt has appt 11/11 9:00 therapy and 11/12 med management.  Adonus Uselman W RN CN [phone]

## 2020-11-03 NOTE — Congregational Nurse Program (Signed)
  Dept: [phone]   Congregational Nurse Program Note  Date of Encounter: 11/03/2020  Past Medical History: No past medical history on file.  Encounter Details:  CNP Questionnaire - 11/03/20 0905      Questionnaire   Do you give verbal consent to treat you today? Yes    Visit Setting [employer] or Organization    Location Patient Served At [facility]    Patient Status Homeless    Medical Provider No    Insurance Private Insurance    Intervention Support    Housing/Utilities No permanent housing    Transportation Need transportation assistance    Interpersonal Safety Do not feel physically and emotionally safe where you currently live    Food Have food insecurities    Medication Have medication insecurities    Referrals Other   [facility] transportation         Spoke with client Nov 5th about upcoming appointments with [facility]. Set up transportation for Nov 11th 0900 appt and client signed release of liability form. Gave client telephone number for return ride to [facility]. He reports he currently has a court date on the 12th and he will cancel and reschedule appt when he goes to see his therapist. Client is currently working with [facility]. Offered to help with medical appts and client reports he will return to see Clinical research associate after he gets established with his mental health services. Jessice Madill W RN CN [phone]

## 2020-11-03 NOTE — Telephone Encounter (Signed)
Set up transportation from [facility] to [facility] on 11/11 for 0900 appt.

## 2020-11-09 ENCOUNTER — Ambulatory Visit (HOSPITAL_COMMUNITY): Payer: Self-pay | Admitting: Licensed Clinical Social Worker

## 2020-11-10 ENCOUNTER — Ambulatory Visit (HOSPITAL_COMMUNITY): Payer: Self-pay | Admitting: Psychiatry

## 2020-11-15 ENCOUNTER — Encounter: Payer: Self-pay | Admitting: *Deleted

## 2020-11-15 NOTE — Congregational Nurse Program (Signed)
  Dept: [phone]   Congregational Nurse Program Note  Date of Encounter: 11/15/2020  Past Medical History: No past medical history on file.  Encounter Details:  CNP Questionnaire - 11/15/20 0936      Questionnaire   Do you give verbal consent to treat you today? Yes    Visit Setting Church or Organization    Location Patient Served At [facility]    Patient Status Homeless    Medical Provider No    Insurance Private Insurance    Intervention Support    Housing/Utilities No permanent housing          Client came by nurse's office to report he canceled his prior [facility] appt and has another appt on Dec 3rd. Client says his rt hand has been bothering him at night and he wants to see a MD after he gets established with [facility]. He is currently working and wants to have a set schedule before making an appt. Offered encouragement and support with appointments and transportation if needed. Client acknowledges understanding. Nera Haworth RN CN [phone]

## 2020-11-22 ENCOUNTER — Encounter: Payer: Self-pay | Admitting: *Deleted

## 2020-11-22 NOTE — Congregational Nurse Program (Signed)
  Dept: [phone]   Congregational Nurse Program Note  Date of Encounter: 11/22/2020  Past Medical History: No past medical history on file.  Encounter Details:  CNP Questionnaire - 11/22/20 1011      Questionnaire   Do you give verbal consent to treat you today? Yes    Visit Setting Church or Organization    Location Patient Served At [facility]    Patient Status Homeless   Staying in [facility]   Medical Provider No    Insurance Private Insurance    Intervention Refer;Support    Housing/Utilities No permanent housing    Transportation Need transportation assistance    Referrals PCP - other provider          Client came to office to have B/P checked. He has been having weakness with his legs and recently fell at work. He would like an appt with a MD for a physical. Called [facility] to see if they accept client's insurance. Have paperwork for client to complete and take to medical office to schedule an appt. Client has currently left the building. Casey Maxfield W RN CN [phone]

## 2020-12-08 ENCOUNTER — Ambulatory Visit (HOSPITAL_COMMUNITY): Payer: Self-pay | Admitting: Physician Assistant

## 2020-12-20 ENCOUNTER — Encounter: Payer: Self-pay | Admitting: *Deleted

## 2020-12-20 ENCOUNTER — Telehealth: Payer: Self-pay | Admitting: *Deleted

## 2020-12-20 NOTE — Telephone Encounter (Signed)
Made appt with [facility] for February 16th 0800 and 0900.

## 2020-12-20 NOTE — Congregational Nurse Program (Signed)
  Dept: [phone]   Congregational Nurse Program Note  Date of Encounter: 12/20/2020  Past Medical History: No past medical history on file.  Encounter Details:  CNP Questionnaire - 12/20/20 0925      Questionnaire   Do you give verbal consent to treat you today? Yes    Visit Setting Church or Organization    Location Patient Served At [facility]    Patient Status Homeless    Medical Provider No    Insurance Private Insurance    Intervention Refer;Support    Housing/Utilities No permanent housing    Transportation Need transportation assistance    Referrals PCP - other provider;Urgent Care;Behavioral/Mental Health Provider    ED Visit Averted Yes          Client came to [facility] reporting he missed his last [facility] appt and wanted to reschedule another one. Client also c/o having a boil on his buttocks making it painful to sit. He said he was planning to go to the ED. Called [facility] and rescheduled appt for counseling and medication management for Feb 16th. Client has completed paper work from last [facility] visit for [facility]. Explained to client he could take his paperwork there and schedule a medical appt or if he felt he needed immediate assistance, he could go to [facility] on [address]. Client was given two bus passes for transportation. Daejon Lich W RN [phone]

## 2021-02-09 ENCOUNTER — Ambulatory Visit (HOSPITAL_COMMUNITY)
Admission: EM | Admit: 2021-02-09 | Discharge: 2021-02-10 | Disposition: A | Discharge status: Home / Self Care | Payer: No Payment, Other | Attending: Psychiatry | Admitting: Psychiatry

## 2021-02-09 ENCOUNTER — Other Ambulatory Visit: Payer: Self-pay

## 2021-02-09 ENCOUNTER — Encounter: Payer: Self-pay | Admitting: *Deleted

## 2021-02-09 ENCOUNTER — Telehealth: Payer: Self-pay | Admitting: *Deleted

## 2021-02-09 DIAGNOSIS — F4310 Post-traumatic stress disorder, unspecified: Secondary | ICD-10-CM | POA: Insufficient documentation

## 2021-02-09 DIAGNOSIS — Z79899 Other long term (current) drug therapy: Secondary | ICD-10-CM | POA: Insufficient documentation

## 2021-02-09 DIAGNOSIS — R45851 Suicidal ideations: Secondary | ICD-10-CM | POA: Insufficient documentation

## 2021-02-09 DIAGNOSIS — F1290 Cannabis use, unspecified, uncomplicated: Secondary | ICD-10-CM | POA: Insufficient documentation

## 2021-02-09 DIAGNOSIS — F419 Anxiety disorder, unspecified: Secondary | ICD-10-CM | POA: Insufficient documentation

## 2021-02-09 DIAGNOSIS — F332 Major depressive disorder, recurrent severe without psychotic features: Secondary | ICD-10-CM | POA: Diagnosis not present

## 2021-02-09 DIAGNOSIS — F17210 Nicotine dependence, cigarettes, uncomplicated: Secondary | ICD-10-CM | POA: Insufficient documentation

## 2021-02-09 DIAGNOSIS — R450 Nervousness: Secondary | ICD-10-CM | POA: Insufficient documentation

## 2021-02-09 DIAGNOSIS — F4323 Adjustment disorder with mixed anxiety and depressed mood: Secondary | ICD-10-CM | POA: Diagnosis not present

## 2021-02-09 DIAGNOSIS — Z20822 Contact with and (suspected) exposure to covid-19: Secondary | ICD-10-CM | POA: Insufficient documentation

## 2021-02-09 LAB — COMPREHENSIVE METABOLIC PANEL
ALT: 19 U/L (ref 0–44)
AST: 14 U/L — ABNORMAL LOW (ref 15–41)
Albumin: 4.2 g/dL (ref 3.5–5.0)
Alkaline Phosphatase: 42 U/L (ref 38–126)
Anion gap: 12 (ref 5–15)
BUN: 16 mg/dL (ref 6–20)
CO2: 23 mmol/L (ref 22–32)
Calcium: 9.7 mg/dL (ref 8.9–10.3)
Chloride: 104 mmol/L (ref 98–111)
Creatinine, Ser: 1.11 mg/dL (ref 0.61–1.24)
GFR, Estimated: >60 mL/min (ref >60)
Glucose, Bld: 81 mg/dL (ref 70–99)
Potassium: 4 mmol/L (ref 3.5–5.1)
Sodium: 139 mmol/L (ref 135–145)
Total Bilirubin: 0.8 mg/dL (ref 0.3–1.2)
Total Protein: 7.2 g/dL (ref 6.5–8.1)

## 2021-02-09 LAB — CBC WITH DIFFERENTIAL/PLATELET
Abs Immature Granulocytes: 0.02 10*3/uL (ref 0.00–0.07)
Basophils Absolute: 0.1 10*3/uL (ref 0.0–0.1)
Basophils Relative: 1 %
Eosinophils Absolute: 0.2 10*3/uL (ref 0.0–0.5)
Eosinophils Relative: 3 %
HCT: 46.4 % (ref 39.0–52.0)
Hemoglobin: 15.9 g/dL (ref 13.0–17.0)
Immature Granulocytes: 0 %
Lymphocytes Relative: 28 %
Lymphs Abs: 1.5 10*3/uL (ref 0.7–4.0)
MCH: 28.2 pg (ref 26.0–34.0)
MCHC: 34.3 g/dL (ref 30.0–36.0)
MCV: 82.3 fL (ref 80.0–100.0)
Monocytes Absolute: 0.4 10*3/uL (ref 0.1–1.0)
Monocytes Relative: 8 %
Neutro Abs: 3.1 10*3/uL (ref 1.7–7.7)
Neutrophils Relative %: 60 %
Platelets: 302 10*3/uL (ref 150–400)
RBC: 5.64 MIL/uL (ref 4.22–5.81)
RDW: 13.7 % (ref 11.5–15.5)
WBC: 5.3 10*3/uL (ref 4.0–10.5)
nRBC: 0 % (ref 0.0–0.2)

## 2021-02-09 LAB — ETHANOL: Alcohol, Ethyl (B): <10 mg/dL (ref <10)

## 2021-02-09 LAB — POCT URINE DRUG SCREEN - MANUAL ENTRY (I-SCREEN)
POC Amphetamine UR: NOT DETECTED
POC Buprenorphine (BUP): NOT DETECTED
POC Cocaine UR: POSITIVE — AB
POC Marijuana UR: POSITIVE — AB
POC Methadone UR: NOT DETECTED
POC Methamphetamine UR: NOT DETECTED
POC Morphine: NOT DETECTED
POC Oxazepam (BZO): NOT DETECTED
POC Oxycodone UR: NOT DETECTED
POC Secobarbital (BAR): NOT DETECTED

## 2021-02-09 LAB — RESP PANEL BY RT-PCR (FLU A&B, COVID) ARPGX2
Influenza A by PCR: NEGATIVE
Influenza B by PCR: NEGATIVE
SARS Coronavirus 2 by RT PCR: NEGATIVE

## 2021-02-09 LAB — LIPID PANEL
Cholesterol: 116 mg/dL (ref 0–200)
HDL: 45 mg/dL (ref >40)
LDL Cholesterol: 53 mg/dL (ref 0–99)
Total CHOL/HDL Ratio: 2.6 RATIO
Triglycerides: 90 mg/dL (ref <150)
VLDL: 18 mg/dL (ref 0–40)

## 2021-02-09 LAB — HEMOGLOBIN A1C
Hgb A1c MFr Bld: 5.4 % (ref 4.8–5.6)
Mean Plasma Glucose: 108.28 mg/dL

## 2021-02-09 LAB — POC SARS CORONAVIRUS 2 AG: SARS Coronavirus 2 Ag: NEGATIVE

## 2021-02-09 LAB — TSH: TSH: 1.482 u[IU]/mL (ref 0.350–4.500)

## 2021-02-09 MED ORDER — SERTRALINE HCL 25 MG PO TABS
25.0000 mg | ORAL_TABLET | Freq: Every day | ORAL | Status: DC
  Administered 2021-02-09 – 2021-02-10 (×2): 25 mg via ORAL
  Filled 2021-02-09: qty 7
  Filled 2021-02-09 (×2): qty 1

## 2021-02-09 MED ORDER — RISPERIDONE 1 MG PO TBDP: 1.0000 mg | ORAL_TABLET | Freq: Every day | ORAL | Status: DC

## 2021-02-09 MED ORDER — MAGNESIUM HYDROXIDE 400 MG/5ML PO SUSP: 30.0000 mL | Freq: Every day | ORAL | Status: DC | PRN

## 2021-02-09 MED ORDER — RISPERIDONE 1 MG PO TBDP
1.0000 mg | ORAL_TABLET | Freq: Two times a day (BID) | ORAL | Status: DC
Start: 2021-02-09 — End: 2021-02-09

## 2021-02-09 MED ORDER — TRAZODONE HCL 50 MG PO TABS
50.0000 mg | ORAL_TABLET | Freq: Every evening | ORAL | Status: DC | PRN
Start: 2021-02-09 — End: 2021-02-10
  Administered 2021-02-09: 50 mg via ORAL
  Filled 2021-02-09: qty 1
  Filled 2021-02-09: qty 7

## 2021-02-09 MED ORDER — ACETAMINOPHEN 325 MG PO TABS: 650.0000 mg | ORAL_TABLET | Freq: Four times a day (QID) | ORAL | Status: DC | PRN

## 2021-02-09 MED ORDER — ALUM & MAG HYDROXIDE-SIMETH 200-200-20 MG/5ML PO SUSP: 30.0000 mL | ORAL | Status: DC | PRN

## 2021-02-09 MED ORDER — RISPERIDONE 1 MG PO TBDP
1.0000 mg | ORAL_TABLET | Freq: Once | ORAL | Status: AC
  Administered 2021-02-09: 1 mg via ORAL
  Filled 2021-02-09: qty 1

## 2021-02-09 MED ORDER — HYDROXYZINE HCL 25 MG PO TABS
25.0000 mg | ORAL_TABLET | Freq: Three times a day (TID) | ORAL | Status: DC | PRN
  Administered 2021-02-09: 25 mg via ORAL
  Filled 2021-02-09: qty 1
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
## 2021-02-09 NOTE — ED Notes (Signed)
LOCKER 27 

## 2021-02-09 NOTE — Progress Notes (Incomplete)
Hunter Andrews became very anxious before talking with the social worker

## 2021-02-09 NOTE — Telephone Encounter (Signed)
Called [employer] and scheduled a ride to [employer].

## 2021-02-09 NOTE — ED Notes (Signed)
Pt sleeping at present, no distress noted, monitoring for safety. 

## 2021-02-09 NOTE — ED Notes (Signed)
Pt A&O x 4, no distress noted,  Calm & cooperative, watching TV at present.  Monitoring for safety.
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
## 2021-02-09 NOTE — Progress Notes (Addendum)
Received Hunter Andrews at the [facility] after his admission process was completed. He remained depressed and anxious. He eventually ate and laid down on his chair bed. Later he was medicated per order and talked with the social worker related to his next step.

## 2021-02-09 NOTE — Progress Notes (Signed)
CSW met with patient at bedside after being observed as agitated. Patient reported that he was upset with staff and requested multiple time to be discharged despite not having housing plans. CSW provided patient with residential substance use programs to call, patient was initially agreeable with this plan. Patient later told nurses that none of the contacts were reachable; this is factually incorrect. Patient continued to antagonize nursing staff. CSW to remain available upon request.   Signed:  Durenda Hurt, MSW, [place], LCASA 02/09/2021 3:51 PM

## 2021-02-10 MED ORDER — SERTRALINE HCL 25 MG PO TABS: 25.0000 mg | ORAL_TABLET | Freq: Every day | ORAL | 0 refills | Status: DC

## 2021-02-10 MED ORDER — TRAZODONE HCL 50 MG PO TABS: 50.0000 mg | ORAL_TABLET | Freq: Every evening | ORAL | 0 refills | Status: DC | PRN

## 2021-02-10 NOTE — ED Notes (Signed)
Pt given breakfast.

## 2021-02-10 NOTE — Discharge Instructions (Addendum)
Take all medications as prescribed. Keep all follow-up appointments as scheduled.  Do not consume alcohol or use illegal drugs while on prescription medications. Report any adverse effects from your medications to your primary care provider promptly.  In the event of recurrent symptoms or worsening symptoms, call 911, a crisis hotline, or go to the nearest emergency department for evaluation.   

## 2021-02-10 NOTE — ED Notes (Signed)
Pt sleeping at present, no distress noted, monitoring for safety. 

## 2021-02-10 NOTE — ED Notes (Signed)
Discharge instructions provided and Pt stated understanding. Prescriptions given. Safe transport called for services to [facility]. Personal belongings returned from locker. Safety maintained. Pt alert, orient and ambulatory.

## 2021-02-10 NOTE — ED Provider Notes (Signed)
FBC/OBS ASAP Discharge Summary  Date and Time: 02/10/2021 9:45 AM  Name: Hunter Andrews  MRN:  [id number]   Discharge Diagnoses:  Final diagnoses:  Adjustment disorder with mixed anxiety and depressed mood   Evaluation: Hunter Andrews  is awake, alert and oriented x3. He is  requesting to be discharged.  Denying suicidal or homicidal ideations.  Denies auditory or visual hallucination.  States" I was depressed and they asked me to stay for overnight observation, however I am feeling better today."  Patient is requesting for medication samples.  Discussed following up with intensive outpatient programming/partial hospitalization programming at the [facility]  Patient was receptive to plan.  Support, encouragement and reassurance was provided.   Per admission assessment note: 30 yo male with a history of anxiety, depression and PTSD who presents voluntarily to the [facility] for assessment of mood and SI; accompanied bu [facility] RN. Patient is tearful throughout assessment. He states that he has been feeling depressed and had trouble with anxiety and anger. Pt states that he has been homeless for the last 1.5 years but has also been intermittently homeless for a total of 3 years during his life. He states that the first time he was homeless he put his children in DSS custody and then went to [employer] for 7 months, was able to get his kids back and moved in with his GF. He states that his children were with his GF for awhile after they broke up before being placed back in DSS custody. He states that he has not spoken to them in several months. More recently, his mother passed away on New Years and this has been difficult for him.  Total Time spent with patient: 15 minutes  Past Psychiatric History:  Past Medical History: No past medical history on file. No past surgical history on file. Family History:  Family History  Problem Relation Age of Onset  . Diabetes Mother   . Diabetes Other    Family Psychiatric  History:  Social History:  Social History   Substance and Sexual Activity  Alcohol Use Yes   Comment: occ     Social History   Substance and Sexual Activity  Drug Use Yes  . Types: Cocaine, Marijuana   Comment: last smoked marijuana last night    Social History   Socioeconomic History  . Marital status: Single    Spouse name: Not on file  . Number of children: Not on file  . Years of education: Not on file  . Highest education level: Not on file  Occupational History  . Not on file  Tobacco Use  . Smoking status: Current Every Day Smoker    Packs/day: 2.00    Types: Cigarettes  . Smokeless tobacco: Never Used  Vaping Use  . Vaping Use: Never used  Substance and Sexual Activity  . Alcohol use: Yes    Comment: occ  . Drug use: Yes    Types: Cocaine, Marijuana    Comment: last smoked marijuana last night  . Sexual activity: Yes    Birth control/protection: None  Other Topics Concern  . Not on file  Social History Narrative  . Not on file   Social Determinants of Health   Financial Resource Strain: Not on file  Food Insecurity: Not on file  Transportation Needs: Not on file  Physical Activity: Not on file  Stress: Not on file  Social Connections: Not on file   SDOH:  SDOH Screenings   Alcohol Screen: Not  on file  Depression (PHQ2-9): Medium Risk  . PHQ-2 Score: 24  Financial Resource Strain: Not on file  Food Insecurity: Not on file  Housing: Not on file  Physical Activity: Not on file  Social Connections: Not on file  Stress: Not on file  Tobacco Use: Not on file  Transportation Needs: Not on file    Has this patient used any form of tobacco in the last 30 days? (Cigarettes, Smokeless Tobacco, Cigars, and/or Pipes) A prescription for an FDA-approved tobacco cessation medication was offered at discharge and the patient refused  Current Medications:  Current Facility-Administered Medications  Medication Dose Route Frequency Provider Last Rate Last  Admin  . acetaminophen (TYLENOL) tablet 650 mg  650 mg Oral Q6H PRN Estella Husk, MD      . alum & mag hydroxide-simeth (MAALOX/MYLANTA) 200-200-20 MG/5ML suspension 30 mL  30 mL Oral Q4H PRN Estella Husk, MD      . hydrOXYzine (ATARAX/VISTARIL) tablet 25 mg  25 mg Oral TID PRN Estella Husk, MD   25 mg at 02/09/21 2119  . magnesium hydroxide (MILK OF MAGNESIA) suspension 30 mL  30 mL Oral Daily PRN Estella Husk, MD      . sertraline (ZOLOFT) tablet 25 mg  25 mg Oral Daily Estella Husk, MD   25 mg at 02/10/21 0859  . traZODone (DESYREL) tablet 50 mg  50 mg Oral QHS PRN Estella Husk, MD   50 mg at 02/09/21 2119   Current Outpatient Medications  Medication Sig Dispense Refill  . [START ON 02/11/2021] sertraline (ZOLOFT) 25 MG tablet Take 1 tablet (25 mg total) by mouth daily. 30 tablet 0  . traZODone (DESYREL) 50 MG tablet Take 1 tablet (50 mg total) by mouth at bedtime as needed for sleep. 30 tablet 0    PTA Medications: (Not in a hospital admission)   Musculoskeletal  Strength & Muscle Tone: within normal limits Gait & Station: normal Patient leans: N/A  Psychiatric Specialty Exam  Presentation  General Appearance: Appropriate for Environment; Casual  Eye Contact:Fair  Speech:Clear and Coherent; Normal Rate  Speech Volume:Normal  Handedness:No data recorded  Mood and Affect  Mood:Depressed; Anxious; Dysphoric  Affect:Appropriate; Congruent; Tearful   Thought Process  Thought Processes:Coherent; Linear; Goal Directed  Descriptions of Associations:Intact  Orientation:Full (Time, Place and Person)  Thought Content:WDL  Hallucinations:Hallucinations: None  Ideas of Reference:None  Suicidal Thoughts:Suicidal Thoughts: Yes, Passive  Homicidal Thoughts:Homicidal Thoughts: No   Sensorium  Memory:Immediate Good; Recent Good; Remote Fair  Judgment:Fair  Insight:Fair   Executive Functions   Concentration:Good  Attention Span:Good  Recall:Fair  Fund of Knowledge:Good  Language:Good   Psychomotor Activity  Psychomotor Activity:Psychomotor Activity: Restlessness   Assets  Assets:Communication Skills; Desire for Improvement; Resilience   Sleep  Sleep:Sleep: Fair   Physical Exam  Physical Exam ROS Blood pressure 118/68, pulse 72, temperature 97.9 F (36.6 C), temperature source Temporal, resp. rate 18, SpO2 100 %. There is no height or weight on file to calculate BMI.  Demographic Factors:  Male, Low socioeconomic status and Unemployed  Loss Factors: Loss of significant relationship  Historical Factors: Family history of mental illness or substance abuse  Risk Reduction Factors:   Positive social support and Positive therapeutic relationship  Continued Clinical Symptoms:  Depression:   Comorbid alcohol abuse/dependence  Cognitive Features That Contribute To Risk:  Closed-mindedness    Suicide Risk:  Minimal: No identifiable suicidal ideation.  Patients presenting with no risk factors but with morbid  ruminations; may be classified as minimal risk based on the severity of the depressive symptoms  Plan Of Care/Follow-up recommendations:  Activity:  as tolerated Diet:  heart healthy  Disposition: Take all medications as prescribed. Keep all follow-up appointments as scheduled.  Do not consume alcohol or use illegal drugs while on prescription medications. Report any adverse effects from your medications to your primary care provider promptly.  In the event of recurrent symptoms or worsening symptoms, call 911, a crisis hotline, or go to the nearest emergency department for evaluation.   Oneta Rack, NP 02/10/2021, 9:45 AM

## 2021-02-14 ENCOUNTER — Ambulatory Visit (HOSPITAL_COMMUNITY): Payer: No Payment, Other | Admitting: Licensed Clinical Social Worker

## 2021-02-14 ENCOUNTER — Ambulatory Visit (HOSPITAL_COMMUNITY): Payer: No Payment, Other | Admitting: Psychiatry

## 2021-02-23 ENCOUNTER — Encounter: Payer: Self-pay | Admitting: *Deleted

## 2021-02-23 NOTE — Congregational Nurse Program (Signed)
  Dept: [phone]   Congregational Nurse Program Note  Date of Encounter: 02/14/2021  Past Medical History: No past medical history on file.  Encounter Details:  CNP Questionnaire - 02/14/21 1427      Questionnaire   Do you give verbal consent to treat you today? Yes    Visit Setting Church or Organization    Location Patient Served At [facility]    Patient Status Homeless    Medical Provider No    Insurance Uninsured (Includes [employer] [place])    Intervention Refer    Housing/Utilities No permanent housing    Referrals PCP - other provider   Lavinia Sharps NP         Client came in [facility] requesting help with his medications. He brought in two prescriptions Zoloft and Trazodone. Client reports he no longer has insurance or a medical doctor. Spoke with Lavinia Sharps NP and she agreed to see client and discuss getting medication through [facility]. Client went to [employer] to get clothes and left his prescriptions at [facility]. Client did not return. Alerted [facility] staff in case client returns on writer's day off. Margret Moat W RN CN [phone]

## 2021-02-23 NOTE — Congregational Nurse Program (Signed)
  Dept: [phone]   Congregational Nurse Program Note  Date of Encounter: 02/19/2021  Past Medical History: No past medical history on file.  Encounter Details:  CNP Questionnaire - 02/19/21 1437      Questionnaire   Do you give verbal consent to treat you today? Yes    Visit Setting Church or Organization    Location Patient Served At [facility]    Patient Status Homeless    Medical Provider No    Insurance Uninsured (Includes [employer] Card/[employer] [place])    Intervention Refer    Housing/Utilities No permanent housing    Referrals PCP - other provider   Lavinia Sharps NP        Client came into [facility] with his left ankle swollen. He had been given his prescriptions that were left at [facility]. Client requested help filling out form for medical assistance. Assisted client in completing [facility] form to see Lavinia Sharps NP. Daisie Haft W RN CN [phone]

## 2021-07-16 ENCOUNTER — Emergency Department (HOSPITAL_COMMUNITY)
Admission: EM | Admit: 2021-07-16 | Discharge: 2021-07-16 | Disposition: A | Discharge status: Home / Self Care | Payer: Self-pay | Attending: Emergency Medicine | Admitting: Emergency Medicine

## 2021-07-16 ENCOUNTER — Other Ambulatory Visit: Payer: Self-pay

## 2021-07-16 DIAGNOSIS — M545 Low back pain, unspecified: Secondary | ICD-10-CM | POA: Insufficient documentation

## 2021-07-16 DIAGNOSIS — F17210 Nicotine dependence, cigarettes, uncomplicated: Secondary | ICD-10-CM | POA: Insufficient documentation

## 2021-07-16 DIAGNOSIS — Z5900 Homelessness unspecified: Secondary | ICD-10-CM | POA: Insufficient documentation

## 2021-07-16 DIAGNOSIS — R0602 Shortness of breath: Secondary | ICD-10-CM | POA: Insufficient documentation

## 2021-07-16 DIAGNOSIS — M5441 Lumbago with sciatica, right side: Secondary | ICD-10-CM

## 2021-07-16 MED ORDER — KETOROLAC TROMETHAMINE 60 MG/2ML IM SOLN
15.0000 mg | Freq: Once | INTRAMUSCULAR | Status: AC
  Administered 2021-07-16: 15 mg via INTRAMUSCULAR
  Filled 2021-07-16: qty 2

## 2021-07-16 NOTE — Social Work (Signed)
CSW met with Pt at bedside. Provided Pt with taxi voucher for transportation to [facility]. Also provided sack lunch.

## 2021-07-16 NOTE — ED Provider Notes (Signed)
[facility] EMERGENCY DEPARTMENT Provider Note   CSN: [id number] Arrival date & time: 07/16/21  1102     History No chief complaint on file.   Hunter Andrews is a 30 y.o. male presenting to the ED with back pain that started gradually 3 weeks ago.  The patient states that he is homeless and sleeps on concrete and he spends most of his days walking. He notes that his pain acutely worsened 2 days ago and he became short of breath by how much pain he was in. Denies any recent injury or fall. Pain is a 10/10 in severity and is constant in nature. Patient also states that his pain radiates down into his right leg and is accompanied by numbness and tingling that goes down into his toes. He is unable to walk without having to stop frequently because of his pain. He denies any fevers, chills, ha, chest pain, abd pain, n/v/d, urinary retention, fecal incontinence, or any other sxs at this time. Patient denies any IV drug use but does endorse crack cocaine use.   The history is provided by the patient.      No past medical history on file.  There are no problems to display for this patient.   No past surgical history on file.     Family History  Problem Relation Age of Onset   Diabetes Mother    Diabetes Other     Social History   Tobacco Use   Smoking status: Every Day    Packs/day: 2.00    Types: Cigarettes   Smokeless tobacco: Never  Vaping Use   Vaping Use: Never used  Substance Use Topics   Alcohol use: Yes    Comment: occ   Drug use: Yes    Types: Cocaine, Marijuana    Comment: last smoked marijuana last night    Home Medications Prior to Admission medications   Medication Sig Start Date End Date Taking? Authorizing Provider  sertraline (ZOLOFT) 25 MG tablet Take 1 tablet (25 mg total) by mouth daily. 02/11/21   Oneta Rack, NP  traZODone (DESYREL) 50 MG tablet Take 1 tablet (50 mg total) by mouth at bedtime as needed for sleep. 02/10/21   Oneta Rack, NP    Allergies    Ibuprofen  Review of Systems   Review of Systems  Constitutional:  Negative for chills and fever.  HENT:  Negative for sinus pain and sore throat.   Respiratory:  Positive for shortness of breath. Negative for chest tightness and wheezing.   Cardiovascular:  Negative for chest pain and palpitations.  Gastrointestinal:  Negative for abdominal pain, constipation, diarrhea, nausea and vomiting.  Genitourinary:  Negative for difficulty urinating, dysuria and hematuria.  Musculoskeletal:  Positive for back pain. Negative for neck pain.   Physical Exam Updated Vital Signs BP 104/62 (BP Location: Right Arm)   Pulse 70   Temp 97.7 F (36.5 C) (Oral)   Resp 18   SpO2 99%   Physical Exam Constitutional:      General: He is not in acute distress.    Appearance: Normal appearance.  HENT:     Head: Normocephalic and atraumatic.  Eyes:     Pupils: Pupils are equal, round, and reactive to light.  Cardiovascular:     Rate and Rhythm: Normal rate and regular rhythm.     Pulses: Normal pulses.     Heart sounds: Normal heart sounds.  Pulmonary:     Effort: Pulmonary  effort is normal. No respiratory distress.     Breath sounds: Normal breath sounds. No wheezing, rhonchi or rales.  Abdominal:     General: There is no distension.     Palpations: Abdomen is soft.     Tenderness: There is no abdominal tenderness.  Musculoskeletal:        General: Tenderness present. No signs of injury.     Comments: Tenderness to palpation over midline thoracic and lumbar spine  Skin:    General: Skin is warm and dry.  Neurological:     General: No focal deficit present.     Mental Status: He is alert and oriented to person, place, and time. Mental status is at baseline.  Psychiatric:        Mood and Affect: Mood normal.        Behavior: Behavior normal.    ED Results / Procedures / Treatments   Labs (all labs ordered are listed, but only abnormal results are  displayed) Labs Reviewed - No data to display  EKG None  Radiology No results found.  Procedures Procedures None.  Medications Ordered in ED Medications  ketorolac (TORADOL) injection 15 mg (has no administration in time range)    ED Course  I have reviewed the triage vital signs and the nursing notes.  Pertinent labs & imaging results that were available during my care of the patient were reviewed by me and considered in my medical decision making (see chart for details).    MDM Rules/Calculators/A&P                          Patient is a 30 yo male presenting to the ED with gradual onset back pain that began 3 weeks ago and acutely worsened 2 days ago. Patient does not have any red flag sxs at this time and thus imaging is not acutely warranted. He was given 15mg  IM Toradol in the ED for pain management. Patient advised to follow up with PCP. Stable to be discharged.    Final Clinical Impression(s) / ED Diagnoses Final diagnoses:  None    Rx / DC Orders ED Discharge Orders     None        , DO 07/16/21 1844    07/18/21, DO 07/16/21 1942

## 2021-07-16 NOTE — ED Provider Notes (Signed)
Emergency Medicine Provider Triage Evaluation Note  Hunter Andrews , a 30 y.o. male  was evaluated in triage.  Pt complains of gradual onset, constant, diffuse, achy, back pain for the past several weeks. Pt also reports it radiates down his right leg. HE states he woke up this morning and felt short of breath due to the amount of pain he was in. He is homeless and endorses that he sleeps on concrete and does a lot of walking which is likely aggravating his back. He denies any trauma. He has not taken anything for the pain. NO other complaints at this time.  Review of Systems  Positive: + back pain, RLE pain Negative: - weakness/numbness/tingling  Physical Exam  BP 99/74 (BP Location: Right Arm)   Pulse 79   Temp 98.7 F (37.1 C)   Resp 17   SpO2 98%  Gen:   Awake, no distress   Resp:  Normal effort  MSK:   Moves extremities without difficulty  Other:  Diffuse T and L spinal TTP with associated parathoracic/lumbar musculature TTP. ROM Intact to neck and back. Strength limited to RLE due to pain. Sensation intact. 2+ DP pulse.   Medical Decision Making  Medically screening exam initiated at 11:19 AM.  Appropriate orders placed.  Hunter Andrews was informed that the remainder of the evaluation will be completed by another provider, this initial triage assessment does not replace that evaluation, and the importance of remaining in the ED until their evaluation is complete.     Tanda Rockers, PA-C 07/16/21 1121    Arby Barrette, MD 07/26/21 [phone]

## 2021-07-16 NOTE — ED Notes (Signed)
ED Provider at bedside. 

## 2021-07-16 NOTE — ED Triage Notes (Signed)
Pt arrives via [facility] complains of lower back pain radiating to right hip that has been going on for several days. Pt states he is homeless and sleeps on the ground. Pt can walk with assistance. [facility] reports slight swelling to pt's lower back.

## 2021-07-16 NOTE — Discharge Instructions (Addendum)
Please follow up with your primary care physician regarding your back pain. You can take Tylenol as needed for your pain in the mean time. Please return to the ED if you develop any fever, chills, urinary retention, fecal incontinence, or significant worsening of your symptoms.

## 2021-07-17 ENCOUNTER — Emergency Department (HOSPITAL_COMMUNITY): Payer: Self-pay

## 2021-07-17 ENCOUNTER — Other Ambulatory Visit: Payer: Self-pay

## 2021-07-17 ENCOUNTER — Encounter (HOSPITAL_COMMUNITY): Payer: Self-pay

## 2021-07-17 ENCOUNTER — Emergency Department (HOSPITAL_COMMUNITY)
Admission: EM | Admit: 2021-07-17 | Discharge: 2021-07-17 | Disposition: A | Discharge status: Home / Self Care | Payer: Self-pay | Attending: Emergency Medicine | Admitting: Emergency Medicine

## 2021-07-17 DIAGNOSIS — F17210 Nicotine dependence, cigarettes, uncomplicated: Secondary | ICD-10-CM | POA: Insufficient documentation

## 2021-07-17 DIAGNOSIS — M5441 Lumbago with sciatica, right side: Secondary | ICD-10-CM | POA: Insufficient documentation

## 2021-07-17 HISTORY — DX: Anxiety disorder, unspecified: F41.9

## 2021-07-17 HISTORY — DX: Depression, unspecified: F32.A

## 2021-07-17 HISTORY — DX: Post-traumatic stress disorder, unspecified: F43.10

## 2021-07-17 MED ORDER — ACETAMINOPHEN ER 650 MG PO TBCR: 650.0000 mg | EXTENDED_RELEASE_TABLET | Freq: Three times a day (TID) | ORAL | 0 refills | Status: DC | PRN

## 2021-07-17 MED ORDER — LIDOCAINE 5 % EX PTCH
1.0000 | MEDICATED_PATCH | CUTANEOUS | Status: DC
  Administered 2021-07-17: 1 via TRANSDERMAL
  Filled 2021-07-17: qty 1

## 2021-07-17 MED ORDER — METHOCARBAMOL 500 MG PO TABS: 500.0000 mg | ORAL_TABLET | Freq: Two times a day (BID) | ORAL | 0 refills | Status: DC

## 2021-07-17 MED ORDER — ACETAMINOPHEN 325 MG PO TABS
650.0000 mg | ORAL_TABLET | Freq: Once | ORAL | Status: AC
  Administered 2021-07-17: 650 mg via ORAL
  Filled 2021-07-17: qty 2

## 2021-07-17 MED ORDER — METHOCARBAMOL 500 MG PO TABS
500.0000 mg | ORAL_TABLET | Freq: Once | ORAL | Status: AC
  Administered 2021-07-17: 500 mg via ORAL
  Filled 2021-07-17: qty 1

## 2021-07-17 NOTE — ED Provider Notes (Signed)
[facility]-EMERGENCY DEPT Provider Note   CSN: [id number] Arrival date & time: 07/17/21  1649     History Chief Complaint  Patient presents with   Back Pain    Hunter Andrews is a 30 y.o. male with a past medical history significant for anxiety, depression, PTSD who presents to the ED due to worsening low back pain x3 weeks.  Patient states pain is located in the right low back and radiates down right lower extremity.  Patient denies saddle paresthesias, bowel/bladder incontinence, lower extremity numbness/tingling, lower extremity weakness, IV drug use, history of cancer, and fever/chills.  Patient states pain is worse with ambulation.  He has not tried anything for his symptoms.  No known injury.  No previous back surgery.  No abdominal pain, nausea, vomiting, and urinary symptoms.  Patient was evaluated in the ED yesterday for the same complaint with reassuring physical exam.  History obtained from patient and past medical records. No interpreter used during encounter.       Past Medical History:  Diagnosis Date   Anxiety    Depression    PTSD (post-traumatic stress disorder)     There are no problems to display for this patient.   History reviewed. No pertinent surgical history.     Family History  Problem Relation Age of Onset   Cancer Mother    Hypertension Mother    Diabetes Mother    Kidney failure Father    Diabetes Other     Social History   Tobacco Use   Smoking status: Every Day    Packs/day: 2.00    Types: Cigarettes   Smokeless tobacco: Never  Vaping Use   Vaping Use: Never used  Substance Use Topics   Alcohol use: Never    Comment: occ   Drug use: Yes    Types: Cocaine, Marijuana    Comment: last smoked marijuana last night    Home Medications Prior to Admission medications   Medication Sig Start Date End Date Taking? Authorizing Provider  acetaminophen (TYLENOL 8 HOUR) 650 MG CR tablet Take 1 tablet (650 mg total) by  mouth every 8 (eight) hours as needed for pain. 07/17/21  Yes Kyarra Vancamp, Merla Riches, PA-C  methocarbamol (ROBAXIN) 500 MG tablet Take 1 tablet (500 mg total) by mouth 2 (two) times daily. 07/17/21  Yes Jazlyne Gauger C, PA-C  sertraline (ZOLOFT) 25 MG tablet Take 1 tablet (25 mg total) by mouth daily. 02/11/21   Oneta Rack, NP  traZODone (DESYREL) 50 MG tablet Take 1 tablet (50 mg total) by mouth at bedtime as needed for sleep. 02/10/21   Oneta Rack, NP    Allergies    Ibuprofen  Review of Systems   Review of Systems  Constitutional:  Negative for chills and fever.  Gastrointestinal:  Negative for abdominal pain.  Musculoskeletal:  Positive for back pain.   Physical Exam Updated Vital Signs BP 106/86 (BP Location: Left Arm)   Pulse 61   Temp 97.9 F (36.6 C) (Oral)   Resp 16   Ht 6' (1.829 m)   Wt 65.3 kg   SpO2 100%   BMI 19.53 kg/m   Physical Exam Vitals and nursing note reviewed.  Constitutional:      General: He is not in acute distress.    Appearance: He is not ill-appearing.  HENT:     Head: Normocephalic.  Eyes:     Pupils: Pupils are equal, round, and reactive to light.  Cardiovascular:  Rate and Rhythm: Normal rate and regular rhythm.     Pulses: Normal pulses.     Heart sounds: Normal heart sounds. No murmur heard.   No friction rub. No gallop.  Pulmonary:     Effort: Pulmonary effort is normal.     Breath sounds: Normal breath sounds.  Abdominal:     General: Abdomen is flat. There is no distension.     Palpations: Abdomen is soft.     Tenderness: There is no abdominal tenderness. There is no guarding or rebound.  Musculoskeletal:        General: Normal range of motion.     Cervical back: Neck supple.     Comments: No thoracic or lumbar midline tenderness. Reproducible lumbar paraspinal tenderness.  Bilateral lower extremities neurovascularly intact.  Patient able to ambulate in the ED with a cane without difficulty.  Skin:    General: Skin  is warm and dry.  Neurological:     General: No focal deficit present.     Mental Status: He is alert.  Psychiatric:        Mood and Affect: Mood normal.        Behavior: Behavior normal.    ED Results / Procedures / Treatments   Labs (all labs ordered are listed, but only abnormal results are displayed) Labs Reviewed - No data to display  EKG None  Radiology DG Lumbar Spine Complete  Result Date: 07/17/2021 CLINICAL DATA:  Acute on chronic low back pain. EXAM: LUMBAR SPINE - COMPLETE 4+ VIEW COMPARISON:  November 22, 2011. FINDINGS: Mild grade 1 anterolisthesis of L5-S1 is noted secondary to bilateral L5 spondylolysis. No acute fracture is noted. Disc spaces are well-maintained. IMPRESSION: Mild grade 1 anterolisthesis of L5-S1 secondary to bilateral L5 spondylolysis. Electronically Signed   By: Lupita Raider M.D.   On: 07/17/2021 18:46    Procedures Procedures   Medications Ordered in ED Medications  lidocaine (LIDODERM) 5 % 1 patch (1 patch Transdermal Patch Applied 07/17/21 2050)  methocarbamol (ROBAXIN) tablet 500 mg (500 mg Oral Given 07/17/21 2050)  acetaminophen (TYLENOL) tablet 650 mg (650 mg Oral Given 07/17/21 2050)    ED Course  I have reviewed the triage vital signs and the nursing notes.  Pertinent labs & imaging results that were available during my care of the patient were reviewed by me and considered in my medical decision making (see chart for details).    MDM Rules/Calculators/A&P                           30 year old male presents to the ED due to persistent low back pain x3 weeks.  Patient denies saddle paresthesias, bowel/bladder cons, lower extremity numbness/tingling, lower extremity weakness, IV drug use, fever/chills, trauma, and history of cancer.  Patient evaluated in the ED yesterday with reassuring physical exam.  No cervical, thoracic, or lumbar midline tenderness.  Bilateral lower extremities neurovascularly intact with soft compartments.  Low  suspicion for cauda equina or central cord compression.  X-ray personally reviewed which demonstrates: IMPRESSION:  Mild grade 1 anterolisthesis of L5-S1 secondary to bilateral L5  spondylolysis.   Patient given Tylenol, Robaxin, and Lidoderm patch here in the ED with symptomatic relief.  Patient discharged with same.  Neurosurgery given to patient discharge.  Instructed patient to call tomorrow to schedule appoint for further evaluation. Strict ED precautions discussed with patient. Patient states understanding and agrees to plan. Patient discharged home in no acute distress  and stable vitals  Final Clinical Impression(s) / ED Diagnoses Final diagnoses:  Acute right-sided low back pain with right-sided sciatica    Rx / DC Orders ED Discharge Orders          Ordered    methocarbamol (ROBAXIN) 500 MG tablet  2 times daily        07/17/21 2054    acetaminophen (TYLENOL 8 HOUR) 650 MG CR tablet  Every 8 hours PRN        07/17/21 2054             Jesusita Oka 07/17/21 2110    Koleen Distance, MD 07/17/21 2322

## 2021-07-17 NOTE — Discharge Instructions (Addendum)
It was a pleasure taking care of you today.  As discussed, your x-ray showed some mild slipping of your vertebrae.  I am sending you home with pain medication and a muscle relaxer.  Take as needed for pain.  Muscle relaxer can cause drowsiness so do not drive or operate machinery while on the medication.  You may also purchase over-the-counter Lidoderm patches and Voltaren gel for added pain relief.  I have included the number of the neurosurgeon.  Please call to schedule appointment for further evaluation.  Return to the ER for any worsening symptoms.

## 2021-07-17 NOTE — ED Notes (Signed)
Made attempt at calling pt for vitals. Pt states " Man I can't walk!" Pt raised voice stating he needs a wheelchair to this Clinical research associate and began cursing.

## 2021-07-17 NOTE — ED Triage Notes (Signed)
Patient c/o right lower back pain that radiates into the right hip. Patient states tingling of the right foot. Patient denies any heavy lifting or recent injuries.  Patient was seen at [facility] ED yesterday.

## 2021-07-17 NOTE — ED Provider Notes (Signed)
Emergency Medicine Provider Triage Evaluation Note  Hunter Andrews , a 30 y.o. male  was evaluated in triage.  Pt complains of right-sided low back pain x3 weeks.  Patient denies saddle paresthesias, bowel/bladder incontinence, lower extreme numbness/tingling, lower extremity weakness, IV drug use, fever/chills, history of cancer.  No trauma.  Patient was evaluated at [facility] ED yesterday and patient is upset because he not receive any symptomatic treatment.  Review of Systems  Positive: Back pain Negative: fever  Physical Exam  BP 106/86 (BP Location: Left Arm)   Pulse 61   Temp 97.9 F (36.6 C) (Oral)   Resp 16   Ht 6' (1.829 m)   Wt 65.3 kg   SpO2 100%   BMI 19.53 kg/m  Gen:   Awake, no distress   Resp:  Normal effort  MSK:   Moves extremities without difficulty  Other:    Medical Decision Making  Medically screening exam initiated at 5:56 PM.  Appropriate orders placed.  Hunter Andrews was informed that the remainder of the evaluation will be completed by another provider, this initial triage assessment does not replace that evaluation, and the importance of remaining in the ED until their evaluation is complete.  X-ray lumbar spine. NO red flags.   Mannie Stabile, PA-C 07/17/21 1801    Koleen Distance, MD 07/17/21 2322

## 2021-08-15 ENCOUNTER — Ambulatory Visit (HOSPITAL_COMMUNITY)
Admission: EM | Admit: 2021-08-15 | Discharge: 2021-08-15 | Disposition: A | Discharge status: Home / Self Care | Payer: No Payment, Other | Attending: Family | Admitting: Family

## 2021-08-15 ENCOUNTER — Encounter: Payer: Self-pay | Admitting: *Deleted

## 2021-08-15 ENCOUNTER — Other Ambulatory Visit: Payer: Self-pay

## 2021-08-15 DIAGNOSIS — Z9151 Personal history of suicidal behavior: Secondary | ICD-10-CM | POA: Insufficient documentation

## 2021-08-15 DIAGNOSIS — Z5900 Homelessness unspecified: Secondary | ICD-10-CM | POA: Insufficient documentation

## 2021-08-15 DIAGNOSIS — F332 Major depressive disorder, recurrent severe without psychotic features: Secondary | ICD-10-CM | POA: Insufficient documentation

## 2021-08-15 DIAGNOSIS — Z79899 Other long term (current) drug therapy: Secondary | ICD-10-CM | POA: Insufficient documentation

## 2021-08-15 DIAGNOSIS — F4310 Post-traumatic stress disorder, unspecified: Secondary | ICD-10-CM | POA: Insufficient documentation

## 2021-08-15 MED ORDER — SERTRALINE HCL 25 MG PO TABS: 25.0000 mg | ORAL_TABLET | Freq: Every day | ORAL | 0 refills | Status: DC

## 2021-08-15 MED ORDER — SERTRALINE HCL 25 MG PO TABS: 25.0000 mg | ORAL_TABLET | Freq: Every day | ORAL | 0 refills | Status: AC

## 2021-08-15 MED ORDER — TRAZODONE HCL 50 MG PO TABS: 50.0000 mg | ORAL_TABLET | Freq: Every evening | ORAL | 0 refills | Status: AC | PRN
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
## 2021-08-15 NOTE — ED Notes (Signed)
Pt discharged home with AVS paperwork.  AVS reviewed prior to discharge.  Pt alert, oriented, and ambulatory.  Safety maintained.
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
## 2021-08-15 NOTE — Discharge Instructions (Addendum)
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
## 2021-08-20 ENCOUNTER — Emergency Department (HOSPITAL_COMMUNITY)
Admission: EM | Admit: 2021-08-20 | Discharge: 2021-08-21 | Discharge status: Against Medical Advice | Payer: Self-pay | Attending: Emergency Medicine | Admitting: Emergency Medicine

## 2021-08-20 DIAGNOSIS — F4310 Post-traumatic stress disorder, unspecified: Secondary | ICD-10-CM | POA: Diagnosis present

## 2021-08-20 DIAGNOSIS — F332 Major depressive disorder, recurrent severe without psychotic features: Secondary | ICD-10-CM | POA: Insufficient documentation

## 2021-08-20 DIAGNOSIS — F1994 Other psychoactive substance use, unspecified with psychoactive substance-induced mood disorder: Secondary | ICD-10-CM | POA: Diagnosis present

## 2021-08-20 DIAGNOSIS — F1910 Other psychoactive substance abuse, uncomplicated: Secondary | ICD-10-CM | POA: Diagnosis present

## 2021-08-20 DIAGNOSIS — Z5900 Homelessness unspecified: Secondary | ICD-10-CM

## 2021-08-20 DIAGNOSIS — F419 Anxiety disorder, unspecified: Secondary | ICD-10-CM | POA: Diagnosis present

## 2021-08-20 DIAGNOSIS — R45851 Suicidal ideations: Secondary | ICD-10-CM | POA: Insufficient documentation

## 2021-08-20 DIAGNOSIS — Y900 Blood alcohol level of less than 20 mg/100 ml: Secondary | ICD-10-CM | POA: Insufficient documentation

## 2021-08-20 DIAGNOSIS — F32A Depression, unspecified: Secondary | ICD-10-CM | POA: Diagnosis present

## 2021-08-20 DIAGNOSIS — Z20822 Contact with and (suspected) exposure to covid-19: Secondary | ICD-10-CM | POA: Insufficient documentation

## 2021-08-20 DIAGNOSIS — F17210 Nicotine dependence, cigarettes, uncomplicated: Secondary | ICD-10-CM | POA: Insufficient documentation

## 2021-08-20 DIAGNOSIS — F1914 Other psychoactive substance abuse with psychoactive substance-induced mood disorder: Secondary | ICD-10-CM | POA: Insufficient documentation

## 2021-08-20 DIAGNOSIS — F1490 Cocaine use, unspecified, uncomplicated: Secondary | ICD-10-CM | POA: Insufficient documentation

## 2021-08-20 DIAGNOSIS — F1290 Cannabis use, unspecified, uncomplicated: Secondary | ICD-10-CM | POA: Insufficient documentation

## 2021-08-20 LAB — COMPREHENSIVE METABOLIC PANEL
ALT: 20 U/L (ref 0–44)
AST: 27 U/L (ref 15–41)
Albumin: 4.1 g/dL (ref 3.5–5.0)
Alkaline Phosphatase: 43 U/L (ref 38–126)
Anion gap: 5 (ref 5–15)
BUN: 13 mg/dL (ref 6–20)
CO2: 27 mmol/L (ref 22–32)
Calcium: 9.3 mg/dL (ref 8.9–10.3)
Chloride: 106 mmol/L (ref 98–111)
Creatinine, Ser: 1.01 mg/dL (ref 0.61–1.24)
GFR, Estimated: >60 mL/min (ref >60)
Glucose, Bld: 124 mg/dL — ABNORMAL HIGH (ref 70–99)
Potassium: 3.7 mmol/L (ref 3.5–5.1)
Sodium: 138 mmol/L (ref 135–145)
Total Bilirubin: 0.5 mg/dL (ref 0.3–1.2)
Total Protein: 7.1 g/dL (ref 6.5–8.1)

## 2021-08-20 LAB — RAPID URINE DRUG SCREEN, HOSP PERFORMED
Amphetamines: NOT DETECTED
Barbiturates: NOT DETECTED
Benzodiazepines: NOT DETECTED
Cocaine: POSITIVE — AB
Opiates: NOT DETECTED
Tetrahydrocannabinol: POSITIVE — AB

## 2021-08-20 LAB — CBC
HCT: 45.8 % (ref 39.0–52.0)
Hemoglobin: 14.7 g/dL (ref 13.0–17.0)
MCH: 27.7 pg (ref 26.0–34.0)
MCHC: 32.1 g/dL (ref 30.0–36.0)
MCV: 86.4 fL (ref 80.0–100.0)
Platelets: 296 10*3/uL (ref 150–400)
RBC: 5.3 MIL/uL (ref 4.22–5.81)
RDW: 14.1 % (ref 11.5–15.5)
WBC: 9.1 10*3/uL (ref 4.0–10.5)
nRBC: 0 % (ref 0.0–0.2)

## 2021-08-20 LAB — RESP PANEL BY RT-PCR (FLU A&B, COVID) ARPGX2
Influenza A by PCR: NEGATIVE
Influenza B by PCR: NEGATIVE
SARS Coronavirus 2 by RT PCR: NEGATIVE

## 2021-08-20 LAB — SALICYLATE LEVEL: Salicylate Lvl: <7 mg/dL — ABNORMAL LOW (ref 7.0–30.0)

## 2021-08-20 LAB — ETHANOL: Alcohol, Ethyl (B): <10 mg/dL (ref <10)

## 2021-08-20 LAB — ACETAMINOPHEN LEVEL: Acetaminophen (Tylenol), Serum: <10 ug/mL — ABNORMAL LOW (ref 10–30)

## 2021-08-20 MED ORDER — GABAPENTIN 300 MG PO CAPS
300.0000 mg | ORAL_CAPSULE | Freq: Three times a day (TID) | ORAL | Status: DC
  Administered 2021-08-20: 300 mg via ORAL
  Filled 2021-08-20: qty 1

## 2021-08-20 MED ORDER — SERTRALINE HCL 25 MG PO TABS
25.0000 mg | ORAL_TABLET | Freq: Every day | ORAL | Status: DC
  Administered 2021-08-20: 25 mg via ORAL
  Filled 2021-08-20: qty 1

## 2021-08-20 MED ORDER — ZIPRASIDONE MESYLATE 20 MG IM SOLR: 20.0000 mg | Freq: Four times a day (QID) | INTRAMUSCULAR | Status: DC | PRN

## 2021-08-20 MED ORDER — ARIPIPRAZOLE 5 MG PO TABS
5.0000 mg | ORAL_TABLET | Freq: Every day | ORAL | Status: DC
  Administered 2021-08-20: 5 mg via ORAL
  Filled 2021-08-20: qty 1

## 2021-08-20 MED ORDER — LORAZEPAM 1 MG PO TABS
1.0000 mg | ORAL_TABLET | Freq: Once | ORAL | Status: AC
  Administered 2021-08-20: 1 mg via ORAL
  Filled 2021-08-20: qty 1

## 2021-08-20 MED ORDER — PRAZOSIN HCL 2 MG PO CAPS
2.0000 mg | ORAL_CAPSULE | Freq: Every day | ORAL | Status: DC
  Administered 2021-08-20: 2 mg via ORAL
  Filled 2021-08-20: qty 1

## 2021-08-20 MED ORDER — TRAZODONE HCL 50 MG PO TABS: 50.0000 mg | ORAL_TABLET | Freq: Every evening | ORAL | Status: DC | PRN

## 2021-08-20 NOTE — ED Notes (Signed)
Pt appears to be asleep. NAD noted. Sitter remains. 

## 2021-08-20 NOTE — ED Provider Notes (Signed)
Pt upset and screaming at Rn.  Pt upset due to TTS wait.   Vitals stable.Ativan 1mg  po ordered.   Geodon Prn if pt become violent    08/20/21 1846    08/22/21, MD 08/20/21 [phone]
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
## 2021-08-20 NOTE — ED Notes (Signed)
Pt appears to be asleep. Psych messaged me and said they recommend pt stay the night and be re-evaluated tomorrow. I told them Dr. Madilyn Hook is the physician now and he said they will let that Dr. Ashley Mariner. Sitter remains.

## 2021-08-20 NOTE — ED Notes (Signed)
Pt took night time meds. Didn't want trazadone. Denies needs.

## 2021-08-20 NOTE — ED Notes (Signed)
Belongings inventoried and placed in locker number 11

## 2021-08-20 NOTE — ED Notes (Signed)
I message psych NP to see where pt is on list for TTS.

## 2021-08-20 NOTE — ED Notes (Signed)
Pt resting in bed, watching tv. Waiting for TTS. Denies needs. Sitter remains.
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
## 2021-08-20 NOTE — ED Triage Notes (Signed)
Pt states that he is feeling suicidal with no plan, denies HI/AVH, reports he his homeless, takes meds for depression but did not take it today

## 2021-08-20 NOTE — ED Notes (Signed)
Lunch tray ordered 

## 2021-08-20 NOTE — ED Notes (Signed)
Introduced myself to pt. Pt AxO x4. GCS 15. Given a snack. Aware that he's waiting for TTS. Denies further needs. Sitter at bedside.

## 2021-08-20 NOTE — ED Notes (Signed)
Psych messaged me and said they didn't want me to wake the pt up to do TTS if it makes him volatile. I told him the pt is aware I have to give him some medications in a little while and I'll wake him up to do that so if they have time we can TTS him then and then cluster his care to keep him calm.

## 2021-08-20 NOTE — ED Notes (Signed)
Pt being TTS currently.

## 2021-08-20 NOTE — ED Notes (Signed)
Pt asked me to message psych about when he was going to be TTS'd. I messaged them and they said it was going to be awhile. Pt then asked about getting his home meds ordered and I told him I would speak to the doctor about it, but it might be a little while if they're in a room. Pt started to get agitated and saying "I'm about to turn up in here. I don't give a fuck about the police or nothing". I kept trying to redirect pt and speak to him about everything. Pt then got angry because he meal tray had unsweet tea on it and he asked the sitter for something else and she asked what he wanted and he started yelling saying she was rude and he's going to go after her if she comes close. I told him that he has to have a sitter because he's here with SI, but she can sit at the desk and watch him. I went and spoke to the ED doctor again who ordered his medications and ordered geodon in case it didn't calm him down and he became violent. He agreed to take a one time dose of PO ativan to help him relax. Pt now sitting and eating his dinner tray quietly.

## 2021-08-21 ENCOUNTER — Encounter (HOSPITAL_COMMUNITY): Payer: Self-pay | Admitting: Registered Nurse

## 2021-08-21 DIAGNOSIS — F1910 Other psychoactive substance abuse, uncomplicated: Secondary | ICD-10-CM

## 2021-08-21 DIAGNOSIS — F32A Depression, unspecified: Secondary | ICD-10-CM | POA: Diagnosis present

## 2021-08-21 DIAGNOSIS — F1994 Other psychoactive substance use, unspecified with psychoactive substance-induced mood disorder: Secondary | ICD-10-CM

## 2021-08-21 DIAGNOSIS — Z5900 Homelessness unspecified: Secondary | ICD-10-CM

## 2021-08-21 DIAGNOSIS — F4310 Post-traumatic stress disorder, unspecified: Secondary | ICD-10-CM | POA: Diagnosis present

## 2021-08-21 DIAGNOSIS — F419 Anxiety disorder, unspecified: Secondary | ICD-10-CM

## 2021-08-21 NOTE — ED Notes (Signed)
Pt ambulatory to restroom

## 2021-08-21 NOTE — ED Provider Notes (Signed)
Emergency Medicine Observation Re-evaluation Note  ELIS SAUBER is a 30 y.o. male, seen on rounds today.  Pt initially presented to the ED for complaints of Suicidal Currently, the patient is walking in room.  Physical Exam  BP 126/67 (BP Location: Left Arm)   Pulse 73   Temp 97.7 F (36.5 C) (Oral)   Resp 18   SpO2 98%  Physical Exam General: NAD Cardiac: normal rate Lungs: no increased WOB Psych: calm  ED Course / MDM  EKG:   I have reviewed the labs performed to date as well as medications administered while in observation.  Recent changes in the last 24 hours include none.  Plan  Current plan is for awaiting revaluation by psych.  BENEN WEIDA is not under involuntary commitment.     Rolan Bucco, MD 08/21/21 1032

## 2021-08-21 NOTE — ED Notes (Signed)
The patient has become increasingly agitated since speaking with the TTS. The patient stated that the person on the TTS call informed that he would most likely be discharged and the patient was agitated that he was not being discharged immediately. Clark-RN informed the patient that it could still be several hours before he was officially discharged. The patient was still agitated but returned to his room.

## 2021-08-21 NOTE — ED Notes (Signed)
Pt appears to be asleep. NAD noted. 

## 2021-08-21 NOTE — Consult Note (Signed)
Telepsych Consultation   Reason for Consult: Suicidal ideation Referring Physician:  Tegeler, Canary Brim, MD Location of Patient: [facility] Location of Provider: Other: [facility]  Patient Identification: QUAVION BOULE MRN:  [id number] Principal Diagnosis: Substance induced mood disorder (HCC) Diagnosis:  Principal Problem:   Substance induced mood disorder (HCC) Active Problems:   PTSD (post-traumatic stress disorder)   Anxiety   Depression   Polysubstance abuse (HCC)   Homelessness   Total Time spent with patient: 30 minutes  Subjective:   MARLENE PFLUGER is a 30 y.o. male patient admitted to [facility] ED after presenting with complaints of suicidal ideation and homelessness.  HPI:  JESHURUN OAXACA, 30 y.o., male patient seen via tele health by this provider, consulted with Dr. Nelly Rout; and chart reviewed on 08/21/21.  On evaluation PERLE GIBBON reports he came to the hospital because he was feeling bad.  Patient states "I was having a difficult time last night and feeling suicidal.  He is probably because I was up all night in the rain and hadn't had not my medication.  But I'm good today.  I'm actually better after getting some rest and getting more mediates."  Patient reports he has been off his medications for a week or so related to medications being stolen.  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  Patient reports he was supposed to see his caseworker yesterday but ended up in the hospital but plans to go there today once he is discharged.  Patient also stating he would like to have resources for shelter/housing, and halfway houses.  Patient also voiced interested in outpatient psychiatric services.  Currently patient denies suicidal/self-harm/homicidal ideation, psychosis, and paranoia. During evaluation JAIVON VANBEEK is sitting up in bed in no acute distress.  He is alert, oriented x 4, calm and cooperative.  His mood is euthymic with congruent  affect.  He does not appear to be responding to internal/external stimuli or delusional thoughts.  Patient denies suicidal/self-harm/homicidal ideation, psychosis, and paranoia.  Patient answered question appropriately. At this time TAISEI BONNETTE is educated and verbalizes understanding of mental health resources and other crisis services in the community. He is instructed to call 911 and present to the nearest emergency room should he experience any suicidal/homicidal ideation, auditory/visual/hallucinations, or detrimental worsening of his mental health condition.    Past Psychiatric History: Patient reported Psychiatric history of PTSD, polysubstance abuse, anxiety, and depression  Past Medical History:  Past Medical History:  Diagnosis Date   Anxiety    Depression    PTSD (post-traumatic stress disorder)    History reviewed. No pertinent surgical history. Family History:  Family History  Problem Relation Age of Onset   Cancer Mother    Hypertension Mother    Diabetes Mother    Kidney failure Father    Diabetes Other    Family Psychiatric  History: None reported Social History:  Social History   Substance and Sexual Activity  Alcohol Use Never   Comment: occ     Social History   Substance and Sexual Activity  Drug Use Yes   Types: Cocaine, Marijuana   Comment: last smoked marijuana last night    Social History   Socioeconomic History   Marital status: Single    Spouse name: Not on file   Number of children: Not on file   Years of education: Not on file   Highest education level: Not on file  Occupational History  Not on file  Tobacco Use   Smoking status: Every Day    Packs/day: 2.00    Types: Cigarettes   Smokeless tobacco: Never  Vaping Use   Vaping Use: Never used  Substance and Sexual Activity   Alcohol use: Never    Comment: occ   Drug use: Yes    Types: Cocaine, Marijuana    Comment: last smoked marijuana last night   Sexual activity: Yes     Birth control/protection: None  Other Topics Concern   Not on file  Social History Narrative   Not on file   Social Determinants of Health   Financial Resource Strain: Not on file  Food Insecurity: Not on file  Transportation Needs: Not on file  Physical Activity: Not on file  Stress: Not on file  Social Connections: Not on file   Additional Social History:    Allergies:   Allergies  Allergen Reactions   Ibuprofen Nausea And Vomiting    Labs:  Results for orders placed or performed during the hospital encounter of 08/20/21 (from the past 48 hour(s))  Comprehensive metabolic panel     Status: Abnormal   Collection Time: 08/20/21  5:25 AM  Result Value Ref Range   Sodium 138 135 - 145 mmol/L   Potassium 3.7 3.5 - 5.1 mmol/L   Chloride 106 98 - 111 mmol/L   CO2 27 22 - 32 mmol/L   Glucose, Bld 124 (H) 70 - 99 mg/dL    Comment: Glucose reference range applies only to samples taken after fasting for at least 8 hours.   BUN 13 6 - 20 mg/dL   Creatinine, Ser 1.611.01 0.61 - 1.24 mg/dL   Calcium 9.3 8.9 - 09.610.3 mg/dL   Total Protein 7.1 6.5 - 8.1 g/dL   Albumin 4.1 3.5 - 5.0 g/dL   AST 27 15 - 41 U/L   ALT 20 0 - 44 U/L   Alkaline Phosphatase 43 38 - 126 U/L   Total Bilirubin 0.5 0.3 - 1.2 mg/dL   GFR, Estimated >04>60 >54>60 mL/min    Comment: (NOTE) Calculated using the CKD-EPI Creatinine Equation (2021)    Anion gap 5 5 - 15    Comment: Performed at [facility], [address] [place], [place] [address]  Ethanol     Status: None   Collection Time: 08/20/21  5:25 AM  Result Value Ref Range   Alcohol, Ethyl (B) <10 <10 mg/dL    Comment: (NOTE) Lowest detectable limit for serum alcohol is 10 mg/dL.  For medical purposes only. Performed at [facility], [address] [place], [place] [address]   Salicylate level     Status: Abnormal   Collection Time: 08/20/21  5:25 AM  Result Value Ref Range   Salicylate Lvl <7.0 (L) 7.0 - 30.0 mg/dL    Comment: Performed  at [facility], [address] [place], [place] [address]  Acetaminophen level     Status: Abnormal   Collection Time: 08/20/21  5:25 AM  Result Value Ref Range   Acetaminophen (Tylenol), Serum <10 (L) 10 - 30 ug/mL    Comment: Performed at [facility], [address] [place], [place] [address]  cbc     Status: None   Collection Time: 08/20/21  5:25 AM  Result Value Ref Range   WBC 9.1 4.0 - 10.5 K/uL   RBC 5.30 4.22 - 5.81 MIL/uL   Hemoglobin 14.7 13.0 - 17.0 g/dL  HCT 45.8 39.0 - 52.0 %   MCV 86.4 80.0 - 100.0 fL   MCH 27.7 26.0 - 34.0 pg   MCHC 32.1 30.0 - 36.0 g/dL   RDW 98.1 19.1 - 47.8 %   Platelets 296 150 - 400 K/uL   nRBC 0.0 0.0 - 0.2 %    Comment: Performed at [facility], [address], [place], [place] [address]  Resp Panel by RT-PCR (Flu A&B, Covid) Nasopharyngeal Swab     Status: None   Collection Time: 08/20/21  5:56 AM   Specimen: Nasopharyngeal Swab; Nasopharyngeal(NP) swabs in vial transport medium  Result Value Ref Range   SARS Coronavirus 2 by RT PCR NEGATIVE NEGATIVE    Comment: (NOTE) SARS-CoV-2 target nucleic acids are NOT DETECTED.  The SARS-CoV-2 RNA is generally detectable in upper respiratory specimens during the acute phase of infection. The lowest concentration of SARS-CoV-2 viral copies this assay can detect is 138 copies/mL. A negative result does not preclude SARS-Cov-2 infection and should not be used as the sole basis for treatment or other patient management decisions. A negative result may occur with  improper specimen collection/handling, submission of specimen other than nasopharyngeal swab, presence of viral mutation(s) within the areas targeted by this assay, and inadequate number of viral copies(<138 copies/mL). A negative result must be combined with clinical observations, patient history, and epidemiological information. The expected result is Negative.  Fact Sheet for Patients:   BloggerCourse.com  Fact Sheet for Healthcare Providers:  SeriousBroker.it  This test is no t yet approved or cleared by the [place] FDA and  has been authorized for detection and/or diagnosis of SARS-CoV-2 by FDA under an Emergency Use Authorization (EUA). This EUA will remain  in effect (meaning this test can be used) for the duration of the COVID-19 declaration under Section 564(b)(1) of the Act, 21 U.S.C.section 360bbb-3(b)(1), unless the authorization is terminated  or revoked sooner.       Influenza A by PCR NEGATIVE NEGATIVE   Influenza B by PCR NEGATIVE NEGATIVE    Comment: (NOTE) The Xpert Xpress SARS-CoV-2/FLU/RSV plus assay is intended as an aid in the diagnosis of influenza from Nasopharyngeal swab specimens and should not be used as a sole basis for treatment. Nasal washings and aspirates are unacceptable for Xpert Xpress SARS-CoV-2/FLU/RSV testing.  Fact Sheet for Patients: BloggerCourse.com  Fact Sheet for Healthcare Providers: SeriousBroker.it  This test is not yet approved or cleared by the [place] FDA and has been authorized for detection and/or diagnosis of SARS-CoV-2 by FDA under an Emergency Use Authorization (EUA). This EUA will remain in effect (meaning this test can be used) for the duration of the COVID-19 declaration under Section 564(b)(1) of the Act, 21 U.S.C. section 360bbb-3(b)(1), unless the authorization is terminated or revoked.  Performed at [facility], [address] [place], [place] [address]   Rapid urine drug screen (hospital performed)     Status: Abnormal   Collection Time: 08/20/21 12:58 PM  Result Value Ref Range   Opiates NONE DETECTED NONE DETECTED   Cocaine POSITIVE (A) NONE DETECTED   Benzodiazepines NONE DETECTED NONE DETECTED   Amphetamines NONE DETECTED NONE DETECTED   Tetrahydrocannabinol POSITIVE (A)  NONE DETECTED   Barbiturates NONE DETECTED NONE DETECTED    Comment: (NOTE) DRUG SCREEN FOR MEDICAL PURPOSES ONLY.  IF CONFIRMATION IS NEEDED FOR ANY PURPOSE, NOTIFY LAB WITHIN 5 DAYS.  LOWEST DETECTABLE LIMITS FOR URINE DRUG SCREEN Drug Class  Cutoff (ng/mL) Amphetamine and metabolites    1000 Barbiturate and metabolites    200 Benzodiazepine                 200 Tricyclics and metabolites     300 Opiates and metabolites        300 Cocaine and metabolites        300 THC                            50 Performed at [facility], [address] [place], [place] [address]     Medications:  Current Facility-Administered Medications  Medication Dose Route Frequency Provider Last Rate Last Admin   ARIPiprazole (ABILIFY) tablet 5 mg  5 mg Oral QHS Sofia, Leslie K, [place]   5 mg at 08/20/21 1846   gabapentin (NEURONTIN) capsule 300 mg  300 mg Oral TID Sofia, Leslie K, PA-C   300 mg at 08/20/21 2104   prazosin (MINIPRESS) capsule 2 mg  2 mg Oral QHS Sofia, Leslie K, [place]   2 mg at 08/20/21 2104   sertraline (ZOLOFT) tablet 25 mg  25 mg Oral Daily Elson Areas, [place]   25 mg at 08/20/21 1844   traZODone (DESYREL) tablet 50 mg  50 mg Oral QHS PRN Cheron Schaumann K, PA-C       ziprasidone (GEODON) injection 20 mg  20 mg Intramuscular Q6H PRN Elson Areas, PA-C       Current Outpatient Medications  Medication Sig Dispense Refill   ABILIFY 5 MG tablet Take 5 mg by mouth at bedtime.     gabapentin (NEURONTIN) 300 MG capsule Take 300 mg by mouth 3 (three) times daily.     prazosin (MINIPRESS) 2 MG capsule Take 2 mg by mouth at bedtime.     sertraline (ZOLOFT) 25 MG tablet Take 1 tablet (25 mg total) by mouth daily. 30 tablet 0   traZODone (DESYREL) 50 MG tablet Take 1 tablet (50 mg total) by mouth at bedtime as needed for sleep. 30 tablet 0    Musculoskeletal: Strength & Muscle Tone: within normal limits Gait & Station: normal Patient leans: N/A  Psychiatric  Specialty Exam:  Presentation  General Appearance: Appropriate for Environment  Eye Contact:Good  Speech:Clear and Coherent; Normal Rate  Speech Volume:Normal  Handedness:Right   Mood and Affect  Mood:Euthymic  Affect:Appropriate; Congruent   Thought Process  Thought Processes:Coherent; Goal Directed  Descriptions of Associations:Intact  Orientation:Full (Time, Place and Person)  Thought Content:WDL  History of Schizophrenia/Schizoaffective disorder:No  Duration of Psychotic Symptoms:Greater than six months  Hallucinations:Hallucinations: None  Ideas of Reference:None  Suicidal Thoughts:Suicidal Thoughts: No  Homicidal Thoughts:Homicidal Thoughts: No   Sensorium  Memory:Immediate Good; Recent Good; Remote Good  Judgment:Intact  Insight:Present   Executive Functions  Concentration:Good  Attention Span:Good  Recall:Good  Fund of Knowledge:Good  Language:Good   Psychomotor Activity  Psychomotor Activity:Psychomotor Activity: Normal   Assets  Assets:Communication Skills; Desire for Improvement; Resilience; Physical Health   Sleep  Sleep:Sleep: Fair (Better)    Physical Exam: Physical Exam Vitals and nursing note reviewed. Exam conducted with a chaperone present.  Constitutional:      General: He is not in acute distress.    Appearance: Normal appearance. He is not ill-appearing.  Cardiovascular:     Rate and Rhythm: Normal rate.  Pulmonary:     Effort: Pulmonary effort is normal.  Neurological:     Mental Status: He  is alert and oriented to person, place, and time.  Psychiatric:        Attention and Perception: Attention and perception normal. He does not perceive auditory or visual hallucinations.        Mood and Affect: Mood and affect normal.        Speech: Speech normal.        Behavior: Behavior normal. Behavior is cooperative.        Thought Content: Thought content normal. Thought content is not paranoid or delusional.  Thought content does not include homicidal or suicidal ideation.        Cognition and Memory: Cognition and memory normal.        Judgment: Judgment normal.   Review of Systems  Constitutional: Negative.   HENT: Negative.    Eyes: Negative.   Respiratory: Negative.    Cardiovascular: Negative.   Gastrointestinal: Negative.   Genitourinary: Negative.   Musculoskeletal:  Positive for back pain.  Skin: Negative.   Neurological: Negative.   Endo/Heme/Allergies: Negative.   Psychiatric/Behavioral:  Positive for substance abuse. Depression: Stable. Hallucinations: Denies. Suicidal ideas: Denies.Nervous/anxious: Stable. Insomnia: Denies.        Patient states he was feeling bad last night related to being outside in the rain and being homeless.  Blood pressure 126/67, pulse 73, temperature 97.7 F (36.5 C), temperature source Oral, resp. rate 18, SpO2 98 %. There is no height or weight on file to calculate BMI.  Treatment Plan Summary: Plan Psychiatrically clear.  Referral to outpatient psychiatric services.  Patient will need resourses for shelter/housing, IRC, and a bust pass.  Disposition: Psychiatrically cleared No evidence of imminent risk to self or others at present.   Patient does not meet criteria for psychiatric inpatient admission. Supportive therapy provided about ongoing stressors. Refer to IOP. Discussed crisis plan, support from social network, calling 911, coming to the Emergency Department, and calling Suicide Hotline.  This service was provided via telemedicine using a 2-way, interactive audio and video technology.  Names of all persons participating in this telemedicine service and their role in this encounter. Name: Assunta Found Role: NP  Name: Dr. Nelly Rout Role: Psychiatrist  Name: Concha Se. Bevan Role: Patient  Name: Edwina Barth, RN EN Role: Patient's nurse sent a secure message informing: Psychiatric consult complete.  Patient psychiatrically cleared.   Patient need resources for outpatient psychiatric services, halfway houses, shelter information.  Will also a bus pass so he can get to the [facility], and resource information on [facility].  Please inform MD only default is listed.    Tamirra Sienkiewicz, NP 08/21/2021 1:26 PM

## 2021-08-21 NOTE — ED Notes (Signed)
Pt appears to be asleep. NAD noted. Sitter remains. 

## 2021-08-21 NOTE — ED Notes (Signed)
Pt continues to sleep.

## 2021-09-05 ENCOUNTER — Encounter: Payer: Self-pay | Admitting: *Deleted

## 2021-09-05 NOTE — Congregational Nurse Program (Signed)
  Dept: [phone]   Congregational Nurse Program Note  Date of Encounter: 09/05/2021  Past Medical History: Past Medical History:  Diagnosis Date   Anxiety    Depression    PTSD (post-traumatic stress disorder)     Encounter Details:  CNP Questionnaire - 09/05/21 0946       Questionnaire   Do you give verbal consent to treat you today? Yes    Visit Setting Church or Organization    Location Patient Served At [facility]    Patient Status Homeless    Medical Provider Yes    Insurance Uninsured (Includes [employer] Card/[employer] [place])    Intervention Support;Refer    Housing/Utilities No permanent housing             Client came to nurses office c/o abdominal cramps from upper mid to lower mid. He reports it started last night along with loose stools. His last loose stool was this morning and he did not eat breakfast. [place] [facility] [place] [facility] bread [place]." Vitals Temp 97.5, 117/72. Referred to Lavinia Sharps NP for additional assessment.  Vennesa Bastedo W RN CN [phone]

## 2021-09-12 ENCOUNTER — Encounter: Payer: Self-pay | Admitting: *Deleted

## 2021-09-12 NOTE — Congregational Nurse Program (Signed)
  Dept: [phone]   Congregational Nurse Program Note  Date of Encounter: 09/12/2021  Past Medical History: Past Medical History:  Diagnosis Date   Anxiety    Depression    PTSD (post-traumatic stress disorder)     Encounter Details:  CNP Questionnaire - 09/12/21 0939       Questionnaire   Do you give verbal consent to treat you today? Yes    Visit Setting Church or Organization    Location Patient Served At [facility]    Patient Status Homeless    Medical Provider Yes    Insurance Uninsured (Includes [employer]/[place])    Intervention Support;Refer    Housing/Utilities No permanent housing    Referrals PCP - other provider   Lavinia Sharps NP           Client stopped Clinical research associate in [facility] lobby and reports rt arm is difficult to move. Per observation red raised area on outer lower arm mildly pink. He said he could have been bitten by something. No breathing difficulty. Client asked about going to ED. Referred to Lavinia Sharps NP. Spoke with NP about client's complaint and requested to see client.  Jasmin Trumbull W RN CN [phone]

## 2022-01-02 ENCOUNTER — Encounter: Payer: Self-pay | Admitting: *Deleted

## 2022-01-02 NOTE — Congregational Nurse Program (Signed)
°  Dept: [phone]   Congregational Nurse Program Note  Date of Encounter: 01/02/2022  Past Medical History: Past Medical History:  Diagnosis Date   Anxiety    Depression    PTSD (post-traumatic stress disorder)     Encounter Details:  CNP Questionnaire - 01/02/22 1423       Questionnaire   Do you give verbal consent to treat you today? Yes    Location Patient Served  [facility]    Visit Setting Church or Organization    Patient Status Homeless    Insurance Uninsured ([employer] Card/[employer]/Self-Pay)    Insurance Referral [employer]    Medication N/A    Medical Provider Yes    Screening Referrals N/A    Medical Referral N/A    Medical Appointment Made N/A    Food N/A    Transportation N/A    Housing/Utilities No permanent housing    Interpersonal Safety N/A    Intervention Support;Educate    ED Visit Averted N/A    Life-Saving Intervention Made N/A            Client came into nurses office holding his rt middle finger reporting he hurt it yesterday on a trailer. Client is moving his finger and the top layer of skin was off with small amount of blood. Cleaned with NS and placed band aide. Instructed to keep clean with soap and water. Discussed signs and symptoms of infection and follow up if needed. Babita Amaker W RN CN

## 2022-03-26 ENCOUNTER — Emergency Department (HOSPITAL_COMMUNITY)
Admission: EM | Admit: 2022-03-26 | Discharge: 2022-03-26 | Disposition: A | Discharge status: Home / Self Care | Payer: 59 | Attending: Emergency Medicine | Admitting: Emergency Medicine

## 2022-03-26 ENCOUNTER — Other Ambulatory Visit: Payer: Self-pay

## 2022-03-26 ENCOUNTER — Encounter (HOSPITAL_COMMUNITY): Payer: Self-pay

## 2022-03-26 DIAGNOSIS — M549 Dorsalgia, unspecified: Secondary | ICD-10-CM | POA: Diagnosis present

## 2022-03-26 DIAGNOSIS — M5431 Sciatica, right side: Secondary | ICD-10-CM | POA: Insufficient documentation

## 2022-03-26 MED ORDER — METHOCARBAMOL 500 MG PO TABS: 500.0000 mg | ORAL_TABLET | Freq: Two times a day (BID) | ORAL | 0 refills | Status: AC

## 2022-03-26 MED ORDER — METHYLPREDNISOLONE SODIUM SUCC 125 MG IJ SOLR
125.0000 mg | Freq: Once | INTRAMUSCULAR | Status: AC
  Administered 2022-03-26: 125 mg via INTRAMUSCULAR
  Filled 2022-03-26: qty 2

## 2022-03-26 MED ORDER — PREDNISONE 20 MG PO TABS: ORAL_TABLET | ORAL | 0 refills | Status: AC

## 2022-03-26 MED ORDER — OXYCODONE-ACETAMINOPHEN 5-325 MG PO TABS
2.0000 | ORAL_TABLET | Freq: Once | ORAL | Status: AC
  Administered 2022-03-26: 2 via ORAL
  Filled 2022-03-26: qty 2

## 2022-03-26 MED ORDER — KETOROLAC TROMETHAMINE 60 MG/2ML IM SOLN
15.0000 mg | Freq: Once | INTRAMUSCULAR | Status: AC
  Administered 2022-03-26: 15 mg via INTRAMUSCULAR
  Filled 2022-03-26: qty 2

## 2022-03-26 NOTE — ED Triage Notes (Addendum)
Patient BIB EMS with complaint of back pain. Pt reports right sided back pain that radiates down his right leg. Hx sciatica  ?

## 2022-03-26 NOTE — ED Notes (Signed)
Pt ambulatory from triage area to lobby without assistance. ?

## 2022-03-26 NOTE — ED Provider Notes (Signed)
?[facility]-EMERGENCY DEPT ?Provider Note ? ? ?CSN: [id number] ?Arrival date & time: 03/26/22  0259 ? ?  ? ?History ? ?Chief Complaint  ?Patient presents with  ? Back Pain  ? ? ?Hunter Andrews is a 31 y.o. male. ? ?31 year old male with a history of sciatica that presents here with a flare of the same.  Patient states that he has tried Neurontin without relief.  States he was post get surgery but the surgeon never got back to them.  No weakness.  No other associated symptoms. States he is homeless and may have trouble getting meds.  ? ? ?Back Pain ? ?  ? ?Home Medications ?Prior to Admission medications   ?Medication Sig Start Date End Date Taking? Authorizing Provider  ?methocarbamol (ROBAXIN) 500 MG tablet Take 1 tablet (500 mg total) by mouth 2 (two) times daily. 03/26/22  Yes Zaeden Lastinger, Barbara Cower, MD  ?predniSONE (DELTASONE) 20 MG tablet 3 tabs po daily x 3 days, then 2 tabs x 3 days, then 1.5 tabs x 3 days, then 1 tab x 3 days, then 0.5 tabs x 3 days 03/26/22  Yes Stepheni Cameron, Barbara Cower, MD  ?ABILIFY 5 MG tablet Take 5 mg by mouth at bedtime. 08/16/21   [provider]  ?gabapentin (NEURONTIN) 300 MG capsule Take 300 mg by mouth 3 (three) times daily. 07/31/21   [provider]  ?prazosin (MINIPRESS) 2 MG capsule Take 2 mg by mouth at bedtime. 06/05/21   [provider]  ?sertraline (ZOLOFT) 25 MG tablet Take 1 tablet (25 mg total) by mouth daily. 08/15/21   Lenard Lance, FNP  ?traZODone (DESYREL) 50 MG tablet Take 1 tablet (50 mg total) by mouth at bedtime as needed for sleep. 08/15/21   Lenard Lance, FNP  ?   ? ?Allergies    ?Ibuprofen   ? ?Review of Systems   ?Review of Systems  ?Musculoskeletal:  Positive for back pain.  ?All other systems reviewed and are negative. ? ?Physical Exam ?Updated Vital Signs ?BP 124/83 (BP Location: Right Arm)   Pulse 78   Temp 97.8 ?F (36.6 ?C) (Oral)   Resp 18   Ht 6' (1.829 m)   Wt 69.9 kg   SpO2 100%   BMI 20.89 kg/m?  ?Physical Exam ?Vitals and  nursing note reviewed.  ?Constitutional:   ?   Appearance: He is well-developed.  ?HENT:  ?   Head: Normocephalic and atraumatic.  ?   Mouth/Throat:  ?   Mouth: Mucous membranes are moist.  ?   Pharynx: Oropharynx is clear.  ?Eyes:  ?   Pupils: Pupils are equal, round, and reactive to light.  ?Cardiovascular:  ?   Rate and Rhythm: Normal rate.  ?Pulmonary:  ?   Effort: Pulmonary effort is normal. No respiratory distress.  ?Abdominal:  ?   General: Abdomen is flat. There is no distension.  ?Musculoskeletal:     ?   General: No swelling or tenderness. Normal range of motion.  ?   Cervical back: Normal range of motion.  ?Skin: ?   General: Skin is warm and dry.  ?   Coloration: Skin is not jaundiced or pale.  ?Neurological:  ?   General: No focal deficit present.  ?   Mental Status: He is alert.  ?   Cranial Nerves: No cranial nerve deficit.  ?   Motor: No weakness.  ? ? ?ED Results / Procedures / Treatments   ?Labs ?(all labs ordered are listed, but  only abnormal results are displayed) ?Labs Reviewed - No data to display ? ?EKG ?None ? ?Radiology ?No results found. ? ?Procedures ?Procedures  ? ? ?Medications Ordered in ED ?Medications  ?methylPREDNISolone sodium succinate (SOLU-MEDROL) 125 mg/2 mL injection 125 mg (has no administration in time range)  ?ketorolac (TORADOL) injection 15 mg (has no administration in time range)  ?oxyCODONE-acetaminophen (PERCOCET/ROXICET) 5-325 MG per tablet 2 tablet (has no administration in time range)  ? ? ?ED Course/ Medical Decision Making/ A&P ?  ?                        ?Medical Decision Making ?Risk ?Prescription drug management. ? ? ?31 yo M here with sciatic flare. Will treat for same. TOC consulted to help with medications. He will also follow up with [place] for help in the same.  ? ?Final Clinical Impression(s) / ED Diagnoses ?Final diagnoses:  ?Sciatica of right side  ? ? ?Rx / DC Orders ?ED Discharge Orders   ? ?      Ordered  ?  predniSONE (DELTASONE) 20 MG tablet        ? 03/26/22 0658  ?  methocarbamol (ROBAXIN) 500 MG tablet  2 times daily       ? 03/26/22 0658  ? ?  ?  ? ?  ? ? ?  ?Marily Memos, MD ?03/26/22 [phone] ? ?

## 2022-03-27 ENCOUNTER — Telehealth: Payer: Self-pay

## 2022-03-27 NOTE — Telephone Encounter (Signed)
RNCM received discontinued TOC consult for possible assistance with medications. RNCM called patient [phone], unable to leave voicemail message. ?

## 2022-06-07 ENCOUNTER — Emergency Department (HOSPITAL_COMMUNITY): Payer: 59

## 2022-06-07 ENCOUNTER — Emergency Department (HOSPITAL_COMMUNITY)
Admission: EM | Admit: 2022-06-07 | Discharge: 2022-06-07 | Disposition: A | Discharge status: Home / Self Care | Payer: 59 | Attending: Emergency Medicine | Admitting: Emergency Medicine

## 2022-06-07 ENCOUNTER — Encounter (HOSPITAL_COMMUNITY): Payer: Self-pay | Admitting: Emergency Medicine

## 2022-06-07 ENCOUNTER — Other Ambulatory Visit: Payer: Self-pay

## 2022-06-07 DIAGNOSIS — R109 Unspecified abdominal pain: Secondary | ICD-10-CM | POA: Insufficient documentation

## 2022-06-07 DIAGNOSIS — F1910 Other psychoactive substance abuse, uncomplicated: Secondary | ICD-10-CM | POA: Diagnosis not present

## 2022-06-07 DIAGNOSIS — R300 Dysuria: Secondary | ICD-10-CM | POA: Insufficient documentation

## 2022-06-07 DIAGNOSIS — R10A2 Flank pain, left side: Secondary | ICD-10-CM

## 2022-06-07 LAB — URINALYSIS, ROUTINE W REFLEX MICROSCOPIC
Bilirubin Urine: NEGATIVE
Glucose, UA: NEGATIVE mg/dL
Hgb urine dipstick: NEGATIVE
Ketones, ur: 5 mg/dL — AB
Leukocytes,Ua: NEGATIVE
Nitrite: NEGATIVE
Protein, ur: 30 mg/dL — AB
Specific Gravity, Urine: 1.032 — ABNORMAL HIGH (ref 1.005–1.030)
pH: 5 (ref 5.0–8.0)

## 2022-06-07 LAB — CBC WITH DIFFERENTIAL/PLATELET
Abs Immature Granulocytes: 0.01 10*3/uL (ref 0.00–0.07)
Basophils Absolute: 0.1 10*3/uL (ref 0.0–0.1)
Basophils Relative: 1 %
Eosinophils Absolute: 0.2 10*3/uL (ref 0.0–0.5)
Eosinophils Relative: 2 %
HCT: 43.2 % (ref 39.0–52.0)
Hemoglobin: 14.7 g/dL (ref 13.0–17.0)
Immature Granulocytes: 0 %
Lymphocytes Relative: 26 %
Lymphs Abs: 2.3 10*3/uL (ref 0.7–4.0)
MCH: 28.7 pg (ref 26.0–34.0)
MCHC: 34 g/dL (ref 30.0–36.0)
MCV: 84.4 fL (ref 80.0–100.0)
Monocytes Absolute: 0.5 10*3/uL (ref 0.1–1.0)
Monocytes Relative: 6 %
Neutro Abs: 5.8 10*3/uL (ref 1.7–7.7)
Neutrophils Relative %: 65 %
Platelets: 312 10*3/uL (ref 150–400)
RBC: 5.12 MIL/uL (ref 4.22–5.81)
RDW: 13.2 % (ref 11.5–15.5)
WBC: 8.9 10*3/uL (ref 4.0–10.5)
nRBC: 0 % (ref 0.0–0.2)

## 2022-06-07 LAB — BASIC METABOLIC PANEL
Anion gap: 6 (ref 5–15)
BUN: 12 mg/dL (ref 6–20)
CO2: 28 mmol/L (ref 22–32)
Calcium: 9.5 mg/dL (ref 8.9–10.3)
Chloride: 106 mmol/L (ref 98–111)
Creatinine, Ser: 1.02 mg/dL (ref 0.61–1.24)
GFR, Estimated: >60 mL/min (ref >60)
Glucose, Bld: 100 mg/dL — ABNORMAL HIGH (ref 70–99)
Potassium: 3.9 mmol/L (ref 3.5–5.1)
Sodium: 140 mmol/L (ref 135–145)

## 2022-06-07 MED ORDER — SODIUM CHLORIDE (PF) 0.9 % IJ SOLN
INTRAMUSCULAR | Status: AC
  Filled 2022-06-07: qty 50

## 2022-06-07 MED ORDER — IOHEXOL 9 MG/ML PO SOLN
ORAL | Status: AC
  Administered 2022-06-07: 1000 mL via ORAL
  Filled 2022-06-07: qty 1000

## 2022-06-07 MED ORDER — IOHEXOL 300 MG/ML  SOLN
100.0000 mL | Freq: Once | INTRAMUSCULAR | Status: AC | PRN
  Administered 2022-06-07: 100 mL via INTRAVENOUS

## 2022-06-07 MED ORDER — IOHEXOL 9 MG/ML PO SOLN: 1000.0000 mL | ORAL | Status: AC

## 2022-06-07 MED ORDER — KETOROLAC TROMETHAMINE 30 MG/ML IJ SOLN
30.0000 mg | Freq: Once | INTRAMUSCULAR | Status: AC
  Administered 2022-06-07: 30 mg via INTRAVENOUS
  Filled 2022-06-07: qty 1

## 2022-06-07 NOTE — ED Notes (Signed)
Pt has finished fluid contrast.

## 2022-06-07 NOTE — ED Notes (Signed)
Pt advises being unable to provide urine sample at this time, will monitor. 

## 2022-06-07 NOTE — ED Triage Notes (Signed)
  Patient comes in with L flank pain that started two days ago.  Patient endorses dysuria without any noticeable hematuria.  Patient states smoked some cocaine a few hours ago.  Patient states pain is 10/10, sharp/stabbing pain.

## 2022-06-07 NOTE — Discharge Instructions (Signed)
Findings from your second CT study showed no intussusception of your small intestines.  This is what we are most concerned about given the description of your symptoms.  I will provide information to set up care with gastroenterology if your symptoms return or get worse.  You can take Tylenol/ibuprofen as needed for pain.  Consider adding a fiber supplement to your diet to help regularize your bowel movements.  Please do not hesitate to return to the emergency department if the worrisome signs and symptoms we discussed become apparent.

## 2022-06-07 NOTE — ED Provider Notes (Signed)
  Physical Exam  BP 96/69   Pulse (!) 44   Temp 98.1 F (36.7 C) (Oral)   Resp 12   Ht 6' (1.829 m)   Wt 70.3 kg   SpO2 100%   BMI 21.02 kg/m   Physical Exam  Procedures  Procedures  ED Course / MDM   Clinical Course as of 06/07/22 0645  Fri Jun 07, 2022  0626 CT shows possible intussusception in the LUQ.  I called and discussed this with Dr. Magnus Ivan, who recommends repeat CT with oral contrast to r/o. [RB]    Clinical Course User Index [RB] Roxy Horseman, PA-C   Medical Decision Making Amount and/or Complexity of Data Reviewed Labs: ordered. Radiology: ordered.  Risk Prescription drug management.   Received patient care from PA [employer].  Plan is to go forward with CT on pelvis with oral contrast for possible intussusception.  If positive call back general surgery.  If negative, expect discharge.  Pending UA results as well.  Upon evaluation of patient, patient is in minimal to no pain.  He was reassured with overall negative CT findings -small suspected bowel intussusception in the left hemi abdomen is now resolved and likely reflects an incidental transient peristalsis.  General surgery was contacted again regarding the patient, and they agreed with plans to discharge.  He was provided information to establish care with GI doctor concerning future potential problems.  Urine shows rare bacteria with no WBC, leukocytes or nitrite; doubt UTI.  Patient declined STD testing at this time.  Other laboratory studies unremarkable at this time.  Patient recommended symptomatic therapy with ibuprofen, MiraLAX/fiber supplements as needed to regularize bowel movements.  Worrisome signs and symptoms were discussed with the patient and the patient understanding to return to the emergency department if noticed.  Patient was stable upon discharge.     Peter Garter, [place] 06/07/22 1038    Milagros Loll, MD 06/08/22 [phone]

## 2022-06-07 NOTE — ED Provider Notes (Signed)
[facility]-EMER[facility]ENCY DEPT [facility] Emergency Department Provider Note MRN:  [id number]  Arrival date & time: 06/07/22     Chief Complaint   Flank Pain and substance abuse   History of Present Illness   Hunter Andrews is a 31 y.o. year-old male presents to the ED with chief complaint of left-sided flank pain that started about a day ago.  He reports associated dysuria.  He denies any fever, chills, nausea, vomiting, or diarrhea.  Denies any successful treatments prior to arrival.  He also notes that he used cocaine a few hours ago.  History provided by patient.   Review of Systems  Pertinent review of systems noted in HPI.    Physical Exam   Vitals:   06/07/22 0500 06/07/22 0600  BP: 101/69 96/69  Pulse: (!) 50 (!) 44  Resp: 13 12  Temp:    SpO2: 99% 100%    CONSTITUTIONAL:  non -toxic-appearing, NAD NEURO:  Alert and oriented x 3, CN 3-12 grossly intact EYES:  eyes equal and reactive ENT/NECK:  Supple, no stridor  CARDIO:  normal rate, regular rhythm, appears well-perfused  PULM:  No respiratory distress, CTAB GI/GU:  non-distended, no focal tenderness MSK/SPINE:  No gross deformities, no edema, moves all extremities  SKIN:  no rash, atraumatic   *Additional and/or pertinent findings included in MDM below  Diagnostic and Interventional Summary    EKG Interpretation  Date/Time:  Friday June 07 2022 02:14:02 EDT Ventricular Rate:  55 PR Interval:  142 QRS Duration: 93 QT Interval:  429 QTC Calculation: 411 R Axis:   86 Text Interpretation: Sinus rhythm ST elev, probable normal early repol pattern Confirmed by Nicanor Alcon, April ([id number]) on 06/07/2022 2:49:48 AM       Labs Reviewed  BASIC METABOLIC PANEL - Abnormal; Notable for the following components:      Result Value   Glucose, Bld 100 (*)    All other components within normal limits  CBC WITH DIFFERENTIAL/PLATELET  URINALYSIS, ROUTINE W REFLEX MICROSCOPIC    CT Renal Stone Study  Final Result    CT  ABDOMEN PELVIS W CONTRAST    (Results Pending)    Medications  ketorolac (TORADOL) 30 MG/ML injection 30 mg (30 mg Intravenous Given 06/07/22 0325)     Procedures  /  Critical Care Procedures  ED Course and Medical Decision Making  I have reviewed the triage vital signs, the nursing notes, and pertinent available records from the EMR.  Social Determinants Affecting Complexity of Care: Patient has no clinically significant social determinants affecting this chief complaint..   ED Course: Clinical Course as of 06/07/22 0630  Fri Jun 07, 2022  9390 CT shows possible intussusception in the LUQ.  I called and discussed this with Dr. Magnus Ivan, who recommends repeat CT with oral contrast to r/o. [RB]    Clinical Course User Index [RB] Roxy Horseman, PA-C   Patient here with left sided flank pain.  Top differential diagnoses include KS, UTI, muscle strain. Medical Decision Making Amount and/or Complexity of Data Reviewed Labs: ordered.    Details: no leukocytosis, no electrolyte derangement.  UA pending at shift change. Radiology: ordered.  Risk Prescription drug management.     Consultants: I discussed the case with Dr. Magnus Ivan, who recommends repeat CT with PO contrast.  Original CT done as CT renal due to flank pain and dysuria.   Treatment and Plan: Patient signed out at shift change to [place], [place].  Patient discussed with attending physician, Dr. Nicanor Alcon, who  recommends consultation with general surgery regarding CT findings.  Final Clinical Impressions(s) / ED Diagnoses  No diagnosis found.  ED Discharge Orders     None         Discharge Instructions Discussed with and Provided to Patient:   Discharge Instructions   None      Felipa Furnace 06/07/22 2221    Palumbo, April, MD 06/08/22 9166

## 2022-06-07 NOTE — ED Notes (Signed)
  Patient unable to obtain a urine specimen at this time. 

## 2022-08-20 ENCOUNTER — Ambulatory Visit (HOSPITAL_COMMUNITY)
Admission: EM | Admit: 2022-08-20 | Discharge: 2022-08-20 | Disposition: A | Discharge status: Home / Self Care | Payer: No Payment, Other | Attending: Behavioral Health | Admitting: Behavioral Health

## 2022-08-20 DIAGNOSIS — Z91148 Patient's other noncompliance with medication regimen for other reason: Secondary | ICD-10-CM | POA: Insufficient documentation

## 2022-08-20 DIAGNOSIS — F4321 Adjustment disorder with depressed mood: Secondary | ICD-10-CM

## 2022-08-20 DIAGNOSIS — F331 Major depressive disorder, recurrent, moderate: Secondary | ICD-10-CM

## 2022-08-20 DIAGNOSIS — Z5902 Unsheltered homelessness: Secondary | ICD-10-CM

## 2022-08-20 NOTE — Discharge Instructions (Signed)
Discharge recommendations:   Please see information for follow-up appointment with psychiatry and therapy.  Please follow up with your primary care provider for all medical related needs.   Therapy: We recommend that patient participate in individual therapy to address mental health concerns.  Safety:  The patient should abstain from use of illicit substances/drugs and abuse of any medications. If symptoms worsen or do not continue to improve or if the patient becomes actively suicidal or homicidal then it is recommended that the patient return to the closest hospital emergency department, the [facility], or call 911 for further evaluation and treatment. National Suicide Prevention Lifeline 1-800-SUICIDE or 1-800-273-8255.  About 988 988 offers 24/7 access to trained crisis counselors who can help people experiencing mental health-related distress. People can call or text 988 or chat 988lifeline.org for themselves or if they are worried about a loved one who may need crisis support.      
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
## 2022-08-20 NOTE — ED Notes (Signed)
Patient discharged by provider.

## 2022-08-20 NOTE — Progress Notes (Signed)
   08/20/22 0840  [facility] Triage Screening (Walk-ins at [facility] only)  What Is the Reason for Your Visit/Call Today? Patient presents via [employer] after he was found barefoot and trespassing.  Upon contact with officers, patient endorsed SI and stated he needs help.  He continues to endorse SI, stating he is tired ofliving and "ready to go."  He reports prior attempt in 2008 when he walked in traffic.  He has a hx of multiple past inpatient admissions.  He reports he is compliant with Rx Lexapro, Trazadone and Seroquel.  He states the provider at [facility] manages his medications.  Patient was just released from jail on 8/15.  He has been homeless for 4 years.  He reports he has been using "dope" recently, however does not give further details about use hx or patterns. Patient denies HI and AVH.  How Long Has This Been Causing You Problems? <Week  Have You Recently Had Any Thoughts About Hurting Yourself? Yes  How long ago did you have thoughts about hurting yourself? Today  Are You Planning to Commit Suicide/Harm Yourself At This time? Yes  Have you Recently Had Thoughts About Hurting Someone Karolee Ohs? No  Are You Planning To Harm Someone At This Time? No  Are you currently experiencing any auditory, visual or other hallucinations? No  Have You Used Any Alcohol or Drugs in the Past 24 Hours? Yes  How long ago did you use Drugs or Alcohol? reports using yesterday  What Did You Use and How Much? "dope" - doesn't give additional info  Do you have any current medical co-morbidities that require immediate attention? No  Clinician description of patient physical appearance/behavior: disheveled, had no shoes on when officers made contact, cooperative, AAOx5  What Do You Feel Would Help You the Most Today? Treatment for Depression or other mood problem  If access to [facility] Urgent Care was not available, would you have sought care in the Emergency Department? No  Determination of Need Urgent (48 hours)  Options For Referral  Inpatient Hospitalization;BH Urgent Care;Facility-Based Crisis

## 2023-01-22 ENCOUNTER — Encounter: Payer: Self-pay | Admitting: *Deleted

## 2023-01-22 NOTE — Congregational Nurse Program (Signed)
  Dept: [phone]   Congregational Nurse Program Note  Date of Encounter: 01/22/2023  Past Medical History: Past Medical History:  Diagnosis Date   Anxiety    Depression    PTSD (post-traumatic stress disorder)     Encounter Details:  CNP Questionnaire - 01/22/23 1139       Questionnaire   Ask client: Do you give verbal consent for me to treat you today? Yes    Student Assistance [employer]    Location Patient Served  [facility]    Visit Setting with Client Organization;Phone/Text/Email    Patient Status Unhoused    Insurance Medicaid    Insurance/Financial Assistance Referral N/A    Medication Have Medication Insecurities;Made Appointment for Client    Medical Provider Yes    Screening Referrals Made N/A    Medical Referrals Made Non-[facility] PCP/Clinic    Medical Appointment Made N/A    Recently w/o PCP, now 1st time PCP visit completed due to CNs referral or appointment made N/A    Food N/A    Transportation Need transportation assistance   provided by [employer]   Housing/Utilities No permanent housing    Interpersonal Safety N/A    Interventions Advocate/Support    Abnormal to Normal Screening Since Last CN Visit N/A    Screenings CN Performed N/A    Sent Client to Lab for: N/A    Did client attend any of the following based off CNs referral or appointments made? N/A    ED Visit Averted N/A    Life-Saving Intervention Made N/A            Client seen in [place] office asking about mental health assistance for medication. Client currently has medicaid. Spoke with Marliss Coots NP and she can assist client one more time. Called [facility] and they accept client's insurance. Explained walk in hours for registration services as client does not have video chat access. [employer] is providing transportation assistance and client is scheduled to see Marliss Coots NP today. Marcello Tuzzolino W RN CN

## 2023-04-05 IMAGING — CT CT RENAL STONE PROTOCOL
2 of 4 series · 16 of 46 positions shown, 18 images · non-contrast
Comparison: 07/17/2018

CLINICAL DATA: Flank pain on left for 2 days



[Series 2: axial st · axial · 0.70mm/px · z∈[-502,-122]mm · 13 of 84 slices shown, 15 images]
[im 4/84  soft-tissue]
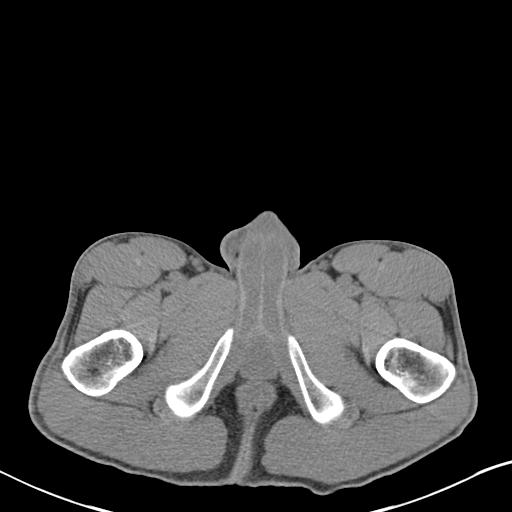
[im 4/84  bone]
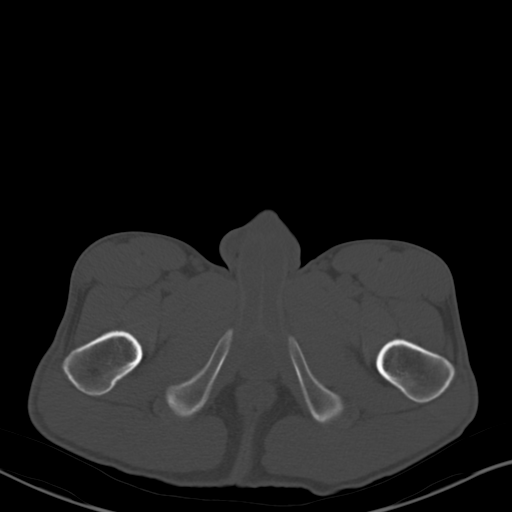
[im 12/84  soft-tissue]
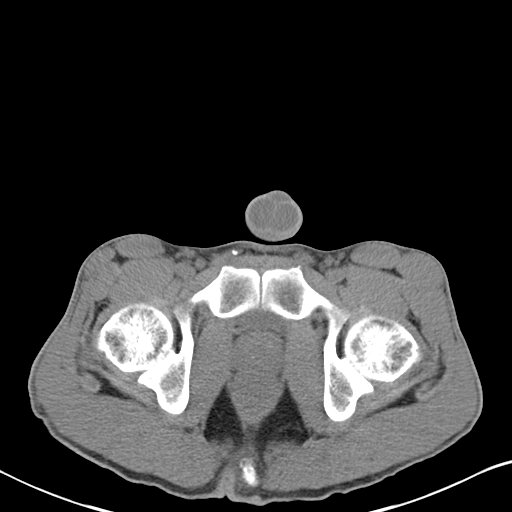
[im 16/84  soft-tissue]
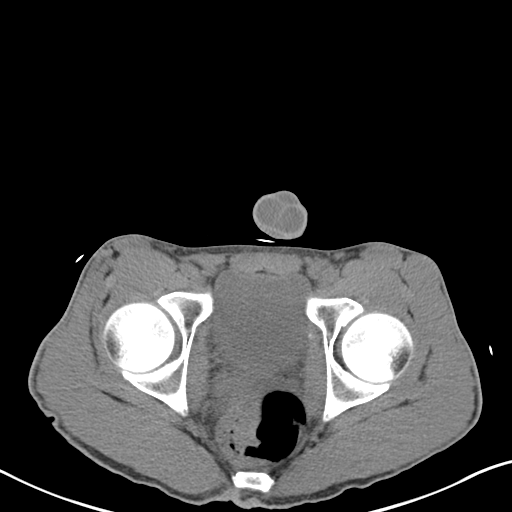
[im 24/84  soft-tissue]
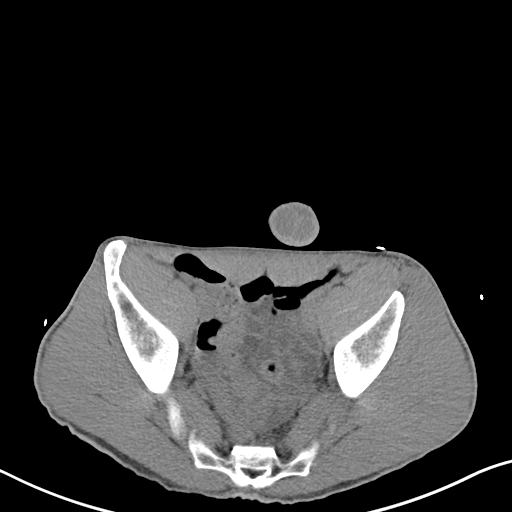
[im 28/84  soft-tissue]
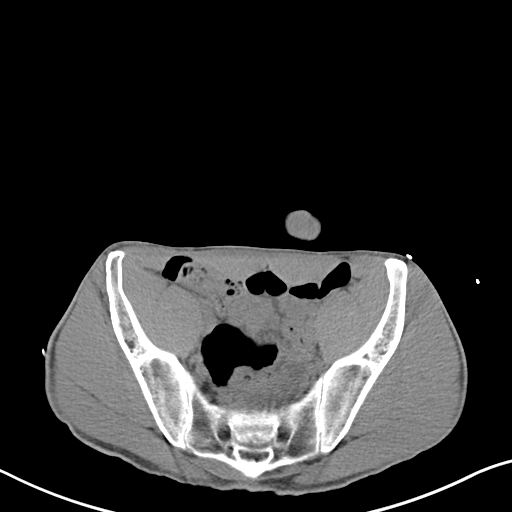
[im 36/84  soft-tissue]
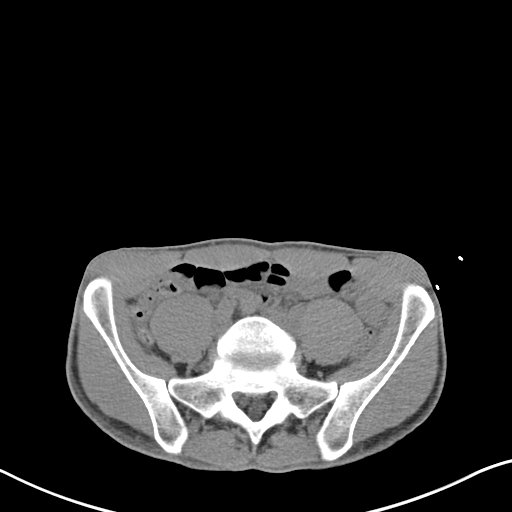
[im 44/84  soft-tissue]
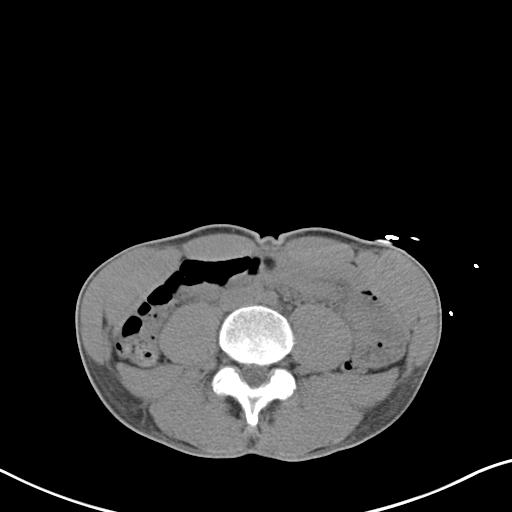
[im 48/84  soft-tissue]
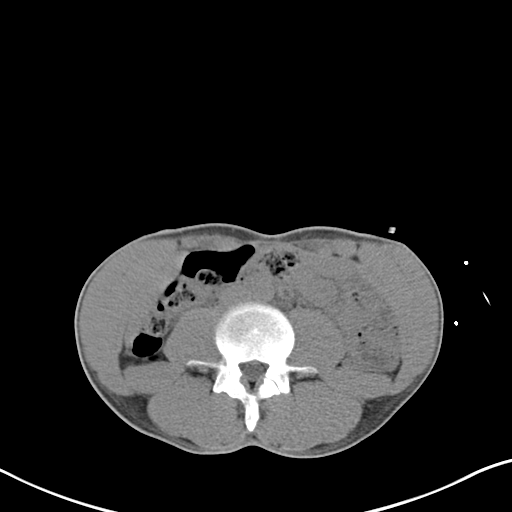
[im 56/84  soft-tissue]
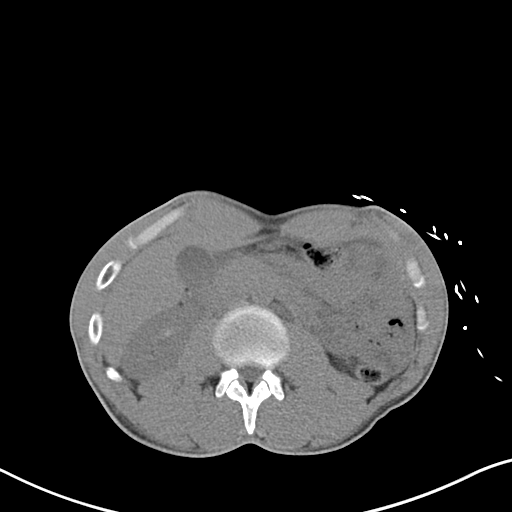
[im 56/84  bone]
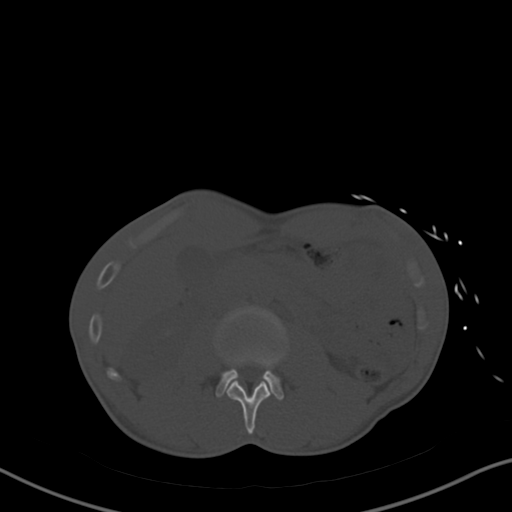
[im 60/84  soft-tissue]
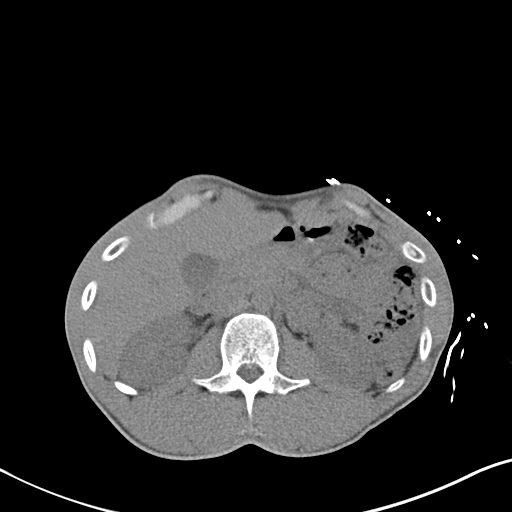
[im 68/84  soft-tissue]
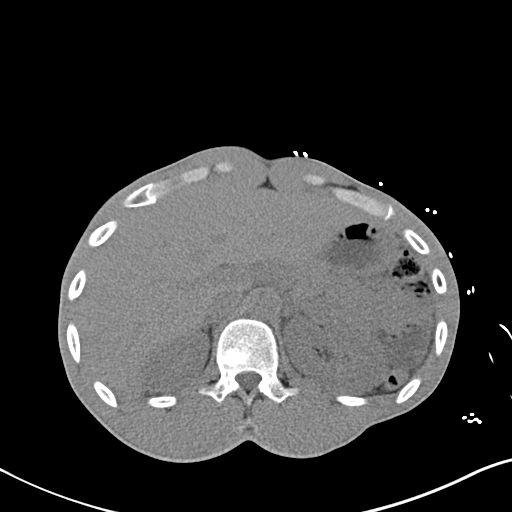
[im 72/84  soft-tissue]
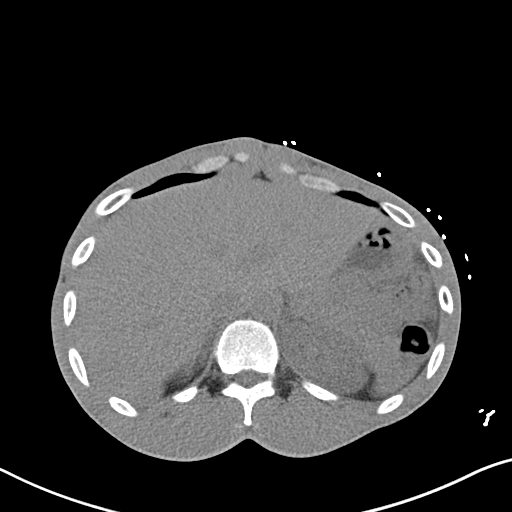
[im 80/84  soft-tissue]
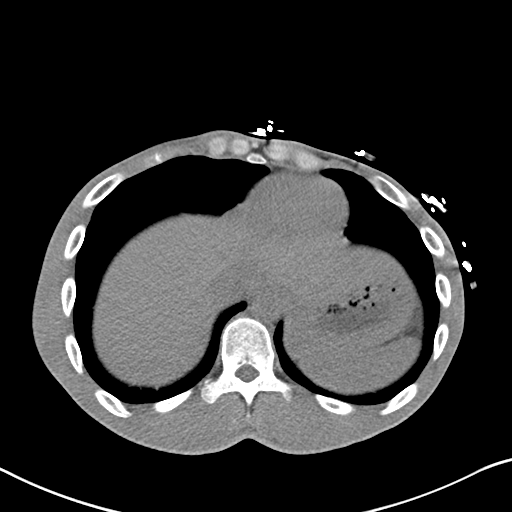

[Series 4: coronal · coronal · 0.62mm/px · 3 of 138 slices shown]
[im 46/138  soft-tissue]
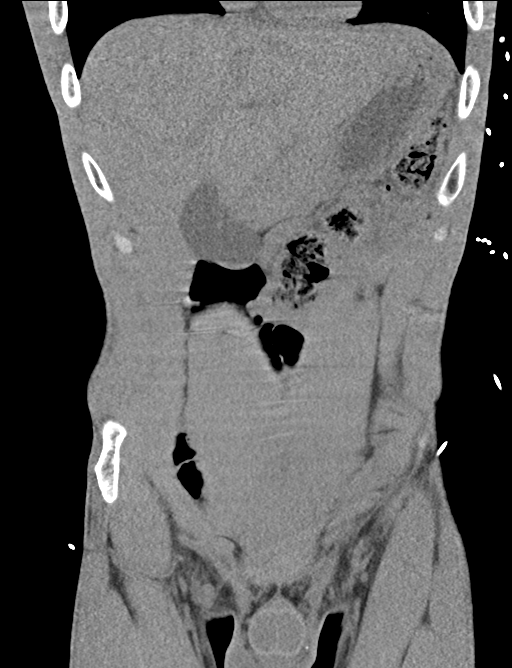
[im 61/138  soft-tissue]
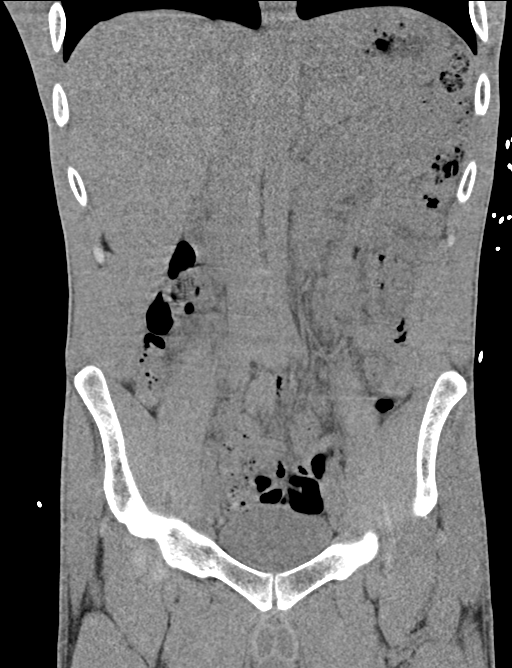
[im 77/138  soft-tissue]
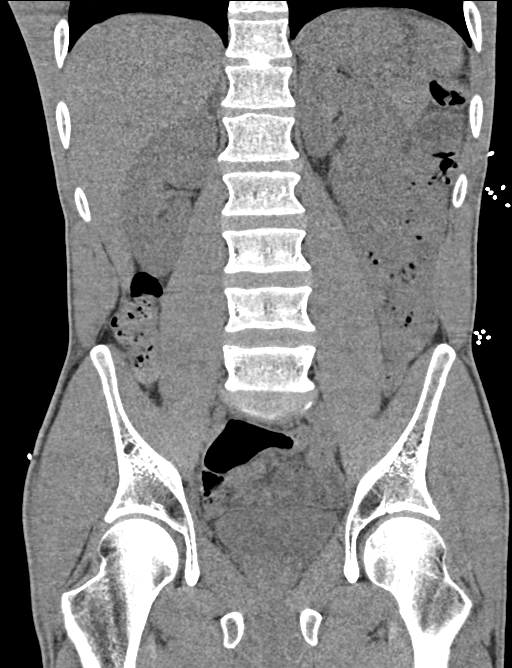

[16 of 46 positions shown; findings below may reference images not displayed]

FINDINGS: Lower chest:  No contributory findings.

Hepatobiliary: No focal liver abnormality.No evidence of biliary
obstruction or stone.

Pancreas: Unremarkable.

Spleen: Unremarkable.

Adrenals/Urinary Tract: Negative adrenals. No hydronephrosis or
ureteral stone. Punctate left renal calculus, see coronal reformats.
Unremarkable bladder.

Stomach/Bowel: Cylindrical fatty structure with central soft tissue
density in the left upper quadrant, likely short segment
intussusception without associated obstruction, usually incidental
and transient. No fatty mass seen in the bowel on prior. No
appendicitis.

Vascular/Lymphatic: No acute vascular abnormality. No mass or
adenopathy.

Reproductive:No pathologic findings.

Other: No ascites or pneumoperitoneum.

Musculoskeletal: No acute abnormalities. Chronic L5 pars defects
with L5-S1 anterolisthesis, disc narrowing, and bulging causing
biforaminal impingement.
IMPRESSION: 1. No hydronephrosis or ureteral calculus. Punctate left renal
calculus.
2. Suspect short segment enteroenteric intussusception in the left
upper quadrant, usually transient and incidental when not associated
with obstruction.
3. Chronic L5 pars defects L5-S1 anterolisthesis and degeneration
causing biforaminal impingement

## 2023-06-07 ENCOUNTER — Emergency Department (HOSPITAL_COMMUNITY)
Admission: EM | Admit: 2023-06-07 | Discharge: 2023-06-08 | Attending: Emergency Medicine | Admitting: Emergency Medicine

## 2023-06-07 ENCOUNTER — Emergency Department (HOSPITAL_COMMUNITY)

## 2023-06-07 DIAGNOSIS — N451 Epididymitis: Secondary | ICD-10-CM | POA: Diagnosis not present

## 2023-06-07 DIAGNOSIS — N50811 Right testicular pain: Secondary | ICD-10-CM | POA: Diagnosis present

## 2023-06-07 MED ORDER — FENTANYL CITRATE PF 50 MCG/ML IJ SOSY
50.0000 ug | PREFILLED_SYRINGE | Freq: Once | INTRAMUSCULAR | Status: AC
  Administered 2023-06-08: 50 ug via INTRAVENOUS
  Filled 2023-06-07: qty 1

## 2023-06-07 NOTE — ED Provider Notes (Signed)
[place] [facility] DEPARTMENT AT [facility] Provider Note   CSN: [id number] Arrival date & time: 06/07/23  2315     History {Add pertinent medical, surgical, social history, OB history to HPI:1} No chief complaint on file.   Hunter Andrews is a 32 y.o. male.  The history is provided by the patient.  Hunter Andrews is a 32 y.o. male who presents to the Emergency Department complaining of testicular pain.  He presents to the emergency department in custody for evaluation of right testicular pain.  He states he noticed some pain and swelling 3 days to 1 week ago.  Pain is constant and radiates to his flank at times.  He also reports beige penile discharge.  No fevers, nausea, vomiting.  Does have occasional dysuria.  No prior similar symptoms.  He has a history of PTSD.  He is sexually active.     Home Medications Prior to Admission medications   Medication Sig Start Date End Date Taking? Authorizing Provider  ABILIFY 5 MG tablet Take 5 mg by mouth at bedtime. Patient not taking: Reported on 06/07/2022 08/16/21   [provider]  gabapentin (NEURONTIN) 300 MG capsule Take 300 mg by mouth 3 (three) times daily. Patient not taking: Reported on 06/07/2022 07/31/21   [provider]  methocarbamol (ROBAXIN) 500 MG tablet Take 1 tablet (500 mg total) by mouth 2 (two) times daily. Patient not taking: Reported on 06/07/2022 03/26/22   Mesner, Barbara Cower, MD  prazosin (MINIPRESS) 2 MG capsule Take 2 mg by mouth at bedtime. Patient not taking: Reported on 06/07/2022 06/05/21   [provider]  predniSONE (DELTASONE) 20 MG tablet 3 tabs po daily x 3 days, then 2 tabs x 3 days, then 1.5 tabs x 3 days, then 1 tab x 3 days, then 0.5 tabs x 3 days Patient not taking: Reported on 06/07/2022 03/26/22   Mesner, Barbara Cower, MD  sertraline (ZOLOFT) 25 MG tablet Take 1 tablet (25 mg total) by mouth daily. Patient not taking: Reported on 06/07/2022 08/15/21   Lenard Lance, FNP  traZODone  (DESYREL) 50 MG tablet Take 1 tablet (50 mg total) by mouth at bedtime as needed for sleep. Patient not taking: Reported on 06/07/2022 08/15/21   Lenard Lance, FNP      Allergies    Ibuprofen    Review of Systems   Review of Systems  All other systems reviewed and are negative.   Physical Exam Updated Vital Signs BP 125/72 (BP Location: Left Arm)   Pulse 63   Temp 98.2 F (36.8 C) (Oral)   Resp 18   Ht 6' (1.829 m)   Wt 66.7 kg   SpO2 99%   BMI 19.94 kg/m  Physical Exam Vitals and nursing note reviewed.  Constitutional:      Appearance: He is well-developed.  HENT:     Head: Normocephalic and atraumatic.  Cardiovascular:     Rate and Rhythm: Normal rate and regular rhythm.  Pulmonary:     Effort: Pulmonary effort is normal. No respiratory distress.  Abdominal:     Palpations: Abdomen is soft.     Tenderness: There is no guarding or rebound.     Comments: Mild generalized abdominal tenderness  Genitourinary:    Comments: There is significant tenderness to palpation over the right scrotum with induration and fullness.  There is no focal fluctuance.  No penile discharge. Musculoskeletal:        General: No tenderness.  Skin:  General: Skin is warm and dry.  Neurological:     Mental Status: He is alert and oriented to person, place, and time.  Psychiatric:        Behavior: Behavior normal.     ED Results / Procedures / Treatments   Labs (all labs ordered are listed, but only abnormal results are displayed) Labs Reviewed - No data to display  EKG None  Radiology No results found.  Procedures Procedures  {Document cardiac monitor, telemetry assessment procedure when appropriate:1}  Medications Ordered in ED Medications  fentaNYL (SUBLIMAZE) injection 50 mcg (has no administration in time range)    ED Course/ Medical Decision Making/ A&P   {   Click here for ABCD2, HEART and other calculatorsREFRESH Note before signing :1}                           Medical Decision Making Amount and/or Complexity of Data Reviewed Labs: ordered. Radiology: ordered.  Risk Prescription drug management.   ***  {Document critical care time when appropriate:1} {Document review of labs and clinical decision tools ie heart score, Chads2Vasc2 etc:1}  {Document your independent review of radiology images, and any outside records:1} {Document your discussion with family members, caretakers, and with consultants:1} {Document social determinants of health affecting pt's care:1} {Document your decision making why or why not admission, treatments were needed:1} Final Clinical Impression(s) / ED Diagnoses Final diagnoses:  None    Rx / DC Orders ED Discharge Orders     None

## 2023-06-08 ENCOUNTER — Encounter (HOSPITAL_COMMUNITY): Payer: Self-pay

## 2023-06-08 ENCOUNTER — Other Ambulatory Visit: Payer: Self-pay

## 2023-06-08 LAB — COMPREHENSIVE METABOLIC PANEL
ALT: 37 U/L (ref 0–44)
AST: 25 U/L (ref 15–41)
Albumin: 3.7 g/dL (ref 3.5–5.0)
Alkaline Phosphatase: 44 U/L (ref 38–126)
Anion gap: 7 (ref 5–15)
BUN: 7 mg/dL (ref 6–20)
CO2: 27 mmol/L (ref 22–32)
Calcium: 9 mg/dL (ref 8.9–10.3)
Chloride: 104 mmol/L (ref 98–111)
Creatinine, Ser: 0.79 mg/dL (ref 0.61–1.24)
GFR, Estimated: >60 mL/min (ref >60)
Glucose, Bld: 89 mg/dL (ref 70–99)
Potassium: 3.8 mmol/L (ref 3.5–5.1)
Sodium: 138 mmol/L (ref 135–145)
Total Bilirubin: 0.3 mg/dL (ref 0.3–1.2)
Total Protein: 6.9 g/dL (ref 6.5–8.1)

## 2023-06-08 LAB — CBC WITH DIFFERENTIAL/PLATELET
Abs Immature Granulocytes: 0.02 10*3/uL (ref 0.00–0.07)
Basophils Absolute: 0 10*3/uL (ref 0.0–0.1)
Basophils Relative: 1 %
Eosinophils Absolute: 0.2 10*3/uL (ref 0.0–0.5)
Eosinophils Relative: 2 %
HCT: 41.5 % (ref 39.0–52.0)
Hemoglobin: 13.2 g/dL (ref 13.0–17.0)
Immature Granulocytes: 0 %
Lymphocytes Relative: 23 %
Lymphs Abs: 1.9 10*3/uL (ref 0.7–4.0)
MCH: 26.3 pg (ref 26.0–34.0)
MCHC: 31.8 g/dL (ref 30.0–36.0)
MCV: 82.7 fL (ref 80.0–100.0)
Monocytes Absolute: 0.5 10*3/uL (ref 0.1–1.0)
Monocytes Relative: 6 %
Neutro Abs: 5.8 10*3/uL (ref 1.7–7.7)
Neutrophils Relative %: 68 %
Platelets: 263 10*3/uL (ref 150–400)
RBC: 5.02 MIL/uL (ref 4.22–5.81)
RDW: 14 % (ref 11.5–15.5)
WBC: 8.4 10*3/uL (ref 4.0–10.5)
nRBC: 0 % (ref 0.0–0.2)

## 2023-06-08 LAB — URINALYSIS, W/ REFLEX TO CULTURE (INFECTION SUSPECTED)
Bilirubin Urine: NEGATIVE
Glucose, UA: NEGATIVE mg/dL
Ketones, ur: NEGATIVE mg/dL
Nitrite: NEGATIVE
Protein, ur: NEGATIVE mg/dL
Specific Gravity, Urine: 1.013 (ref 1.005–1.030)
WBC, UA: >50 WBC/hpf (ref 0–5)
pH: 5 (ref 5.0–8.0)

## 2023-06-08 LAB — RPR: RPR Ser Ql: NONREACTIVE

## 2023-06-08 LAB — HIV ANTIBODY (ROUTINE TESTING W REFLEX): HIV Screen 4th Generation wRfx: NONREACTIVE

## 2023-06-08 MED ORDER — DOXYCYCLINE HYCLATE 100 MG PO TABS
100.0000 mg | ORAL_TABLET | Freq: Once | ORAL | Status: AC
  Administered 2023-06-08: 100 mg via ORAL
  Filled 2023-06-08: qty 1

## 2023-06-08 MED ORDER — DOXYCYCLINE HYCLATE 100 MG PO CAPS: 100.0000 mg | ORAL_CAPSULE | Freq: Two times a day (BID) | ORAL | 0 refills | Status: AC

## 2023-06-08 MED ORDER — SODIUM CHLORIDE 0.9 % IV SOLN
1.0000 g | Freq: Once | INTRAVENOUS | Status: AC
  Administered 2023-06-08: 1 g via INTRAVENOUS
  Filled 2023-06-08: qty 10

## 2023-06-08 NOTE — ED Triage Notes (Signed)
Patient brought in by police, reports right testicle swelling and pain x1 week, denies any difficulties with urination. Reports pain 10/10.

## 2023-06-09 LAB — URINE CULTURE: Culture: NO GROWTH

## 2023-06-09 LAB — GC/CHLAMYDIA PROBE AMP (~~LOC~~) NOT AT ARMC
Chlamydia: POSITIVE — AB
Comment: NEGATIVE
Comment: NORMAL
Neisseria Gonorrhea: POSITIVE — AB

## 2024-09-13 NOTE — Progress Notes (Signed)
Pt is homeless

## 2024-10-21 NOTE — Progress Notes (Signed)
 The patient attended a screening event on 09/13/2024, where his blood pressure was measured at 136/83 mmHg. During the event, the patient did not report his smoking status, he did not indicate his insurance and PCP. The pt has a food, housing, and transportation SDOH need.  A chart review indicates Ronal Jenkins Houseman as his PCP. There are currently no encounters within the last 12 months. The pt has Medicaid. Chart review indicates that he has a food, housing, and transportation. Pt is currently homeless per screening event notes. [facility] has [facility]. Pt's SDOH needs were previously supported via transportation vouchers for appts and getting established to care. Chart review further indicates no smoking status within the last 12 months and has a history in his chart as a smoker.   Call Attempt #1: [facility] called number indicated in chart. Caller shared that the pt is currently in [facility] and receives care while there through [place]. CHW was unable to f/u further on this event at this time due to not being able to speak with the pt about PCP status and SDOH needs.  [facility] is unable to send letter to pt at this time.  An additional follow up will be done at a later date per the [employer] protocol.

## 2024-12-27 NOTE — Progress Notes (Signed)
"   The patient attended a screening event on 09/13/2024, where his blood pressure was measured at 136/83 mmHg. During the event, the patient did not report his smoking status, he did not indicate his insurance and PCP. The pt has a food, housing, and transportation SDOH need.   A chart review indicates Hunter Andrews as his PCP. There are currently no encounters within the last 12 months. The pt has Medicaid. Chart review indicates that he has a food, housing, and transportation. Pt is currently homeless per screening event notes. Pt has previously been [facility] to [facility]. Pt's SDOH needs were previously supported via transportation vouchers for appts and getting established to care. Chart review further indicates no smoking status within the last 12 months and has a history in his chart as a smoker.    Call Attempt #1: [facility] called number indicated in chart. Caller shared that the pt is currently in [facility] and receives care while there through [place]. CHW was unable to f/u further on this event at this time due to not being able to speak with the pt about PCP status and SDOH needs. CHW was unable to send pt a letter at that time.  During the 60 day f/u pt attempted to call pt and the call rang and then went to a busy signal. CHW was unable to leave a VM. Due to the previous note from [facility] stating that the pt was incarcerated the last time any contact was made CHW will send this pt back to registry for a final follow up at a later date per health equity protocol. CHW will not send a letter or resources due to the pt being incarcerated with no email or active Mychart in chart.  "
